# Patient Record
Sex: Female | Born: 1945
Health system: Southern US, Community
[De-identification: ages and names within clinical notes are randomized; demographics above are authoritative.]

## PROBLEM LIST (undated history)

## (undated) DIAGNOSIS — M1990 Unspecified osteoarthritis, unspecified site: Secondary | ICD-10-CM

## (undated) DIAGNOSIS — E785 Hyperlipidemia, unspecified: Secondary | ICD-10-CM

## (undated) DIAGNOSIS — T7840XA Allergy, unspecified, initial encounter: Secondary | ICD-10-CM

## (undated) DIAGNOSIS — E079 Disorder of thyroid, unspecified: Secondary | ICD-10-CM

## (undated) DIAGNOSIS — F79 Unspecified intellectual disabilities: Secondary | ICD-10-CM

## (undated) HISTORY — DX: Hyperlipidemia, unspecified: E78.5

## (undated) HISTORY — DX: Allergy, unspecified, initial encounter: T78.40XA

## (undated) HISTORY — DX: Unspecified osteoarthritis, unspecified site: M19.90

## (undated) HISTORY — PX: KNEE SURGERY: SHX244

## (undated) HISTORY — DX: Disorder of thyroid, unspecified: E07.9

---

## 2004-11-27 ENCOUNTER — Encounter: Admission: RE | Admit: 2004-11-27 | Discharge: 2004-11-27 | Payer: Self-pay | Admitting: Family Medicine

## 2004-12-16 ENCOUNTER — Other Ambulatory Visit: Admission: RE | Admit: 2004-12-16 | Discharge: 2004-12-16 | Payer: Self-pay | Admitting: Family Medicine

## 2006-01-22 ENCOUNTER — Encounter: Admission: RE | Admit: 2006-01-22 | Discharge: 2006-01-22 | Payer: Self-pay | Admitting: Family Medicine

## 2007-01-25 ENCOUNTER — Encounter: Admission: RE | Admit: 2007-01-25 | Discharge: 2007-01-25 | Payer: Self-pay | Admitting: Family Medicine

## 2008-01-30 ENCOUNTER — Encounter: Admission: RE | Admit: 2008-01-30 | Discharge: 2008-01-30 | Payer: Self-pay | Admitting: Family Medicine

## 2009-09-11 ENCOUNTER — Encounter: Admission: RE | Admit: 2009-09-11 | Discharge: 2009-09-11 | Payer: Self-pay | Admitting: Family Medicine

## 2011-01-11 ENCOUNTER — Emergency Department (HOSPITAL_COMMUNITY)
Admission: EM | Admit: 2011-01-11 | Discharge: 2011-01-11 | Disposition: A | Discharge status: Home / Self Care | Payer: Medicare Other | Attending: Emergency Medicine | Admitting: Emergency Medicine

## 2011-01-11 ENCOUNTER — Emergency Department (HOSPITAL_COMMUNITY): Payer: Medicare Other

## 2011-01-11 ENCOUNTER — Other Ambulatory Visit: Payer: Self-pay

## 2011-01-11 DIAGNOSIS — R0602 Shortness of breath: Secondary | ICD-10-CM | POA: Insufficient documentation

## 2011-01-11 DIAGNOSIS — R109 Unspecified abdominal pain: Secondary | ICD-10-CM | POA: Insufficient documentation

## 2011-01-11 DIAGNOSIS — R1110 Vomiting, unspecified: Secondary | ICD-10-CM | POA: Insufficient documentation

## 2011-01-11 DIAGNOSIS — R51 Headache: Secondary | ICD-10-CM | POA: Insufficient documentation

## 2011-01-11 DIAGNOSIS — R509 Fever, unspecified: Secondary | ICD-10-CM | POA: Insufficient documentation

## 2011-01-11 DIAGNOSIS — R4182 Altered mental status, unspecified: Secondary | ICD-10-CM | POA: Insufficient documentation

## 2011-01-11 DIAGNOSIS — N390 Urinary tract infection, site not specified: Secondary | ICD-10-CM

## 2011-01-11 HISTORY — DX: Unspecified intellectual disabilities: F79

## 2011-01-11 LAB — BASIC METABOLIC PANEL
CO2: 26 mEq/L (ref 19–32)
Calcium: 8.3 mg/dL — ABNORMAL LOW (ref 8.4–10.5)
Creatinine, Ser: 0.75 mg/dL (ref 0.50–1.10)
GFR calc non Af Amer: >60 mL/min (ref >60)
Glucose, Bld: 118 mg/dL — ABNORMAL HIGH (ref 70–99)
Sodium: 134 mEq/L — ABNORMAL LOW (ref 135–145)

## 2011-01-11 LAB — URINE MICROSCOPIC-ADD ON

## 2011-01-11 LAB — DIFFERENTIAL
Basophils Absolute: 0.1 10*3/uL (ref 0.0–0.1)
Basophils Relative: 1 % (ref 0–1)
Eosinophils Absolute: 0 10*3/uL (ref 0.0–0.7)
Eosinophils Relative: 0 % (ref 0–5)
Lymphocytes Relative: 11 % — ABNORMAL LOW (ref 12–46)
Neutro Abs: 7.5 10*3/uL (ref 1.7–7.7)

## 2011-01-11 LAB — CBC
MCV: 89.4 fL (ref 78.0–100.0)
RBC: 3.97 MIL/uL (ref 3.87–5.11)
RDW: 12.4 % (ref 11.5–15.5)
WBC: 9.2 10*3/uL (ref 4.0–10.5)

## 2011-01-11 LAB — URINALYSIS, ROUTINE W REFLEX MICROSCOPIC
Bilirubin Urine: NEGATIVE
Ketones, ur: NEGATIVE mg/dL
pH: 6 (ref 5.0–8.0)

## 2011-01-11 MED ORDER — ONDANSETRON HCL 4 MG PO TABS: 4.0000 mg | ORAL_TABLET | Freq: Four times a day (QID) | ORAL | Status: AC

## 2011-01-11 MED ORDER — ONDANSETRON HCL 4 MG/2ML IJ SOLN
4.0000 mg | Freq: Once | INTRAMUSCULAR | Status: AC
  Administered 2011-01-11: 4 mg via INTRAVENOUS
  Filled 2011-01-11: qty 2

## 2011-01-11 MED ORDER — CIPROFLOXACIN HCL 250 MG PO TABS: 250.0000 mg | ORAL_TABLET | Freq: Two times a day (BID) | ORAL | Status: AC

## 2011-01-11 MED ORDER — SODIUM CHLORIDE 0.9 % IV BOLUS (SEPSIS)
500.0000 mL | Freq: Once | INTRAVENOUS | Status: AC
  Administered 2011-01-11: 500 mL via INTRAVENOUS

## 2011-01-11 MED ORDER — DEXTROSE 5 % IV SOLN
1.0000 g | Freq: Once | INTRAVENOUS | Status: AC
  Administered 2011-01-11: 1 g via INTRAVENOUS
  Filled 2011-01-11: qty 1

## 2011-01-11 NOTE — ED Provider Notes (Signed)
History     CSN: [id number] Arrival date & time: 01/11/2011  7:39 PM  Chief Complaint  Patient presents with  . Emesis  . Altered Mental Status  . Gait Problem   HPI Comments: The patient lives with her family members. Last couple of days she's been using the bathroom more frequently has not been acting quite like herself. She's been sleeping a lot more and has felt flushed at times. She had a mild headache. She has not been overtly confused and has not had any focal weakness or numbness.  Patient is a 65 y.o. female presenting with vomiting and altered mental status. The history is provided by the patient and a relative. The history is limited by a language barrier (Patient is very hard of hearing and does not have her hearing aids).  Emesis  This is a new problem. Episode onset: Today. Episode frequency: Waxing and waning. Maximum temperature: Family members think she might have had a fever although they did not measure 1. Associated symptoms include chills, a fever and headaches. Pertinent negatives include no abdominal pain, no cough, no diarrhea, no sweats and no URI.  Altered Mental Status This is a recurrent (Patient has had this problems before when she's had urinary tract infections) problem. Associated symptoms include headaches and shortness of breath. Pertinent negatives include no abdominal pain. The symptoms are aggravated by nothing. The symptoms are relieved by nothing.    Past Medical History  Diagnosis Date  . Mentally disabled     Past Surgical History  Procedure Date  . Knee surgery     No family history on file.  History  Substance Use Topics  . Smoking status: Never Smoker   . Smokeless tobacco: Not on file  . Alcohol Use: No    OB History    Grav Para Term Preterm Abortions TAB SAB Ect Mult Living                  Review of Systems  Constitutional: Positive for fever and chills.  Respiratory: Positive for shortness of breath. Negative for cough.     Gastrointestinal: Positive for vomiting. Negative for abdominal pain and diarrhea.  Neurological: Positive for headaches.  Psychiatric/Behavioral: Positive for altered mental status.  All other systems reviewed and are negative.    Physical Exam  BP 130/58  Pulse 68  Temp(Src) 98 F (36.7 C) (Oral)  Resp 18  Ht 5\' 2"  (1.575 m)  Wt 163 lb 7 oz (74.135 kg)  BMI 29.89 kg/m2  SpO2 97%  Physical Exam  Nursing note and vitals reviewed. Constitutional: She appears well-developed and well-nourished. No distress.  HENT:  Head: Normocephalic and atraumatic.  Right Ear: External ear normal.  Left Ear: External ear normal.  Eyes: Conjunctivae are normal. Right eye exhibits no discharge. Left eye exhibits no discharge. No scleral icterus.  Neck: Neck supple. No tracheal deviation present.  Cardiovascular: Normal rate, regular rhythm and intact distal pulses.   Pulmonary/Chest: Effort normal and breath sounds normal. No stridor. No respiratory distress. She has no wheezes. She has no rales.  Abdominal: Soft. Bowel sounds are normal. She exhibits no distension. There is no tenderness. There is no rebound and no guarding.  Musculoskeletal: She exhibits no edema and no tenderness.  Neurological: She is alert. She has normal strength. She displays no atrophy and no tremor. No cranial nerve deficit ( no gross defecits noted) or sensory deficit. She exhibits normal muscle tone. She displays no seizure activity. Coordination normal.  Skin: Skin is warm and dry. No rash noted. She is not diaphoretic. No pallor.  Psychiatric: She has a normal mood and affect.    ED Course  Procedures Labs Reviewed  URINALYSIS, ROUTINE W REFLEX MICROSCOPIC - Abnormal; Notable for the following:    Appearance HAZY (*)    Hgb urine dipstick SMALL (*)    Leukocytes, UA SMALL (*)    All other components within normal limits  CBC - Abnormal; Notable for the following:    HCT 35.5 (*)    All other components within  normal limits  DIFFERENTIAL - Abnormal; Notable for the following:    Neutrophils Relative 82 (*)    Lymphocytes Relative 11 (*)    All other components within normal limits  BASIC METABOLIC PANEL - Abnormal; Notable for the following:    Sodium 134 (*)    Glucose, Bld 118 (*)    Calcium 8.3 (*)    All other components within normal limits  URINE MICROSCOPIC-ADD ON - Abnormal; Notable for the following:    Squamous Epithelial / LPF MANY (*)    Bacteria, UA MANY (*)    All other components within normal limits   Ct Head Wo Contrast  01/11/2011  *RADIOLOGY REPORT*  Clinical Data: Altered mental status, unsteady gait  CT HEAD WITHOUT CONTRAST  Technique:  Contiguous axial images were obtained from the base of the skull through the vertex without contrast.  Comparison: None.  Findings: No acute hemorrhage, acute infarction, or mass lesion is identified.  No midline shift.  No ventriculomegaly.  No skull fracture.  Partial opacification of the sphenoid sinus is noted. No osseous destruction or air-fluid level.  Mucoperiosteal thickening of the maxillary and ethmoid sinuses is noted.  IMPRESSION: No acute intracranial finding.  Sinusitis.  Original Report Authenticated By: Harrel Lemon, M.D.   Dg Abd Acute W/chest  01/11/2011  *RADIOLOGY REPORT*  Clinical Data: Abdominal pain, weakness  ACUTE ABDOMEN SERIES (ABDOMEN 2 VIEW & CHEST 1 VIEW)  Comparison: None.  Findings: Lung volumes are low with crowding of the bronchovascular markings.  Heart size is mildly enlarged, in part to technique and hypoaeration.  No focal pulmonary opacity.  No pleural effusion. No free air beneath the diaphragms.  Artifact is present on the upright view.  Radiopaque material within the nondilated colon is likely related to ingested material. Gas within presumed redundant sigmoid is noted over the left mid abdomen.  No dilated loop of small bowel or colon.  No air fluid level.  Moderate stool volume noted.  No acute  osseous finding.  IMPRESSION: No acute cardiopulmonary process.  Nonobstructive bowel gas pattern.  Original Report Authenticated By: Harrel Lemon, M.D.     Date: 01/11/2011  Rate: 71  Rhythm: normal sinus rhythm  QRS Axis: normal  Intervals: normal  ST/T Wave abnormalities: normal  Conduction Disutrbances:none  Narrative Interpretation: nl ecg  Old EKG Reviewed: none available    MDM  Patient with unremarkable workup in the emergency department other than possible urinary tract infection. I suspect this might be allergy for her sleepiness and vomiting that she's had today. There doesn't appear to be any evidence of an acute abdominal etiology such as bowel obstruction. She has no tenderness to suggest appendicitis, cholecystitis, pancreatitis, diverticulitis. EKG is normal doubt any cardiac etiology. There is no focal does to suggest any acute neurological cause at this time.  Clinical impression urinary tract infection       Celene Kras, MD  01/11/11 2213 

## 2011-01-11 NOTE — ED Notes (Signed)
Caregiver, reports that pt has been using the bathroom more freq. For several days, today has not been acting like herself. Unsteady gait, sleeping more, felt hot at times.Marland Kitchen

## 2011-01-13 LAB — URINE CULTURE

## 2011-08-18 DIAGNOSIS — R350 Frequency of micturition: Secondary | ICD-10-CM | POA: Diagnosis not present

## 2011-08-18 DIAGNOSIS — Z111 Encounter for screening for respiratory tuberculosis: Secondary | ICD-10-CM | POA: Diagnosis not present

## 2011-08-18 DIAGNOSIS — D649 Anemia, unspecified: Secondary | ICD-10-CM | POA: Diagnosis not present

## 2012-07-19 IMAGING — CT CT HEAD W/O CM
1 series · 16 of 30 positions shown, 20 images · non-contrast
Comparison: None.

CLINICAL DATA: Altered mental status, unsteady gait

CT HEAD WITHOUT CONTRAST
TECHNIQUE: Contiguous axial images were obtained from the base of
the skull through the vertex without contrast.

[Series 2: headtrauma 4.8 h37s · axial · 0.43mm/px · z∈[+1193,+1347]mm · 16 of 36 slices shown, 20 images]
[im 2/36  brain]
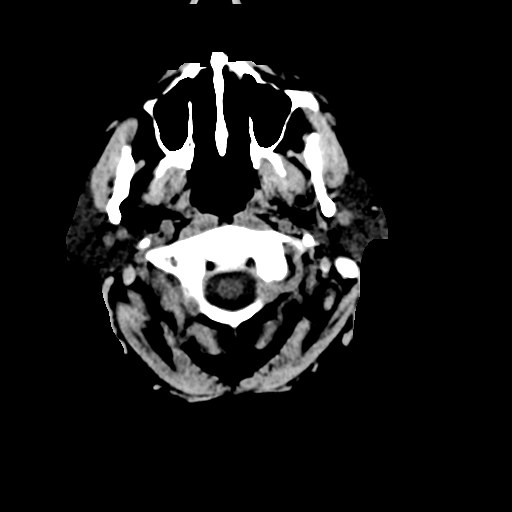
[im 2/36  bone]
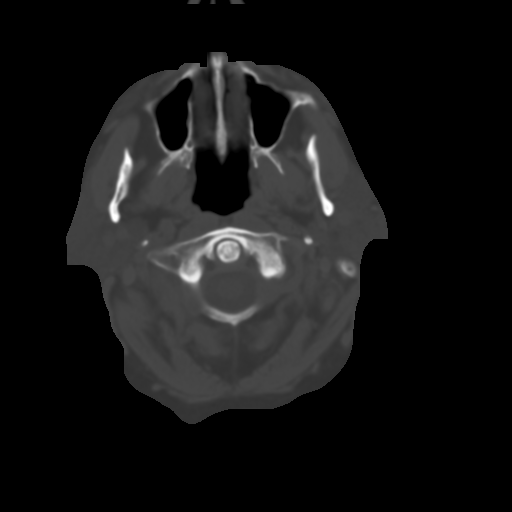
[im 4/36  brain]
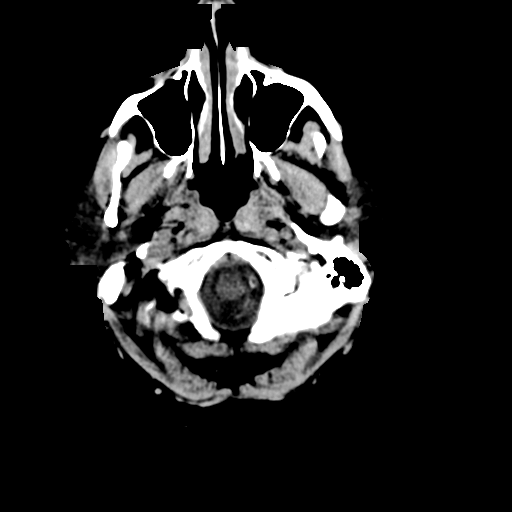
[im 7/36  brain]
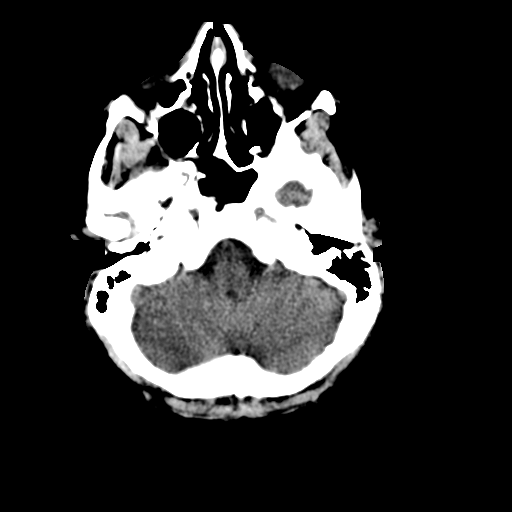
[im 9/36  brain]
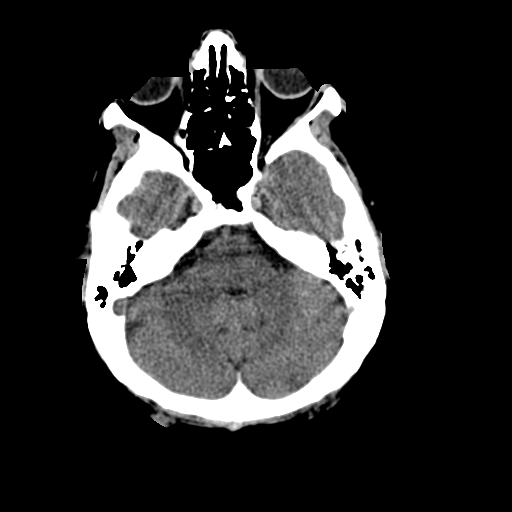
[im 10/36  brain]
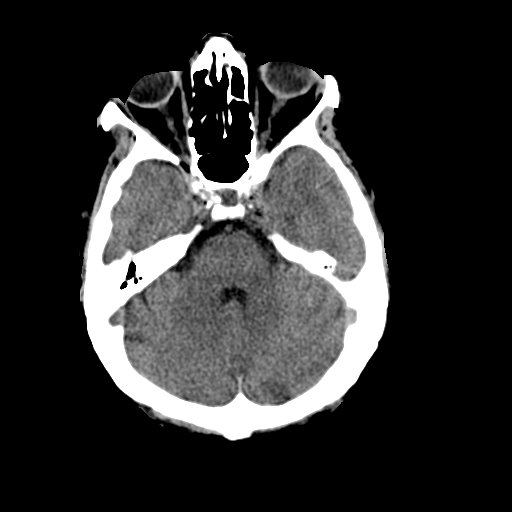
[im 10/36  bone]
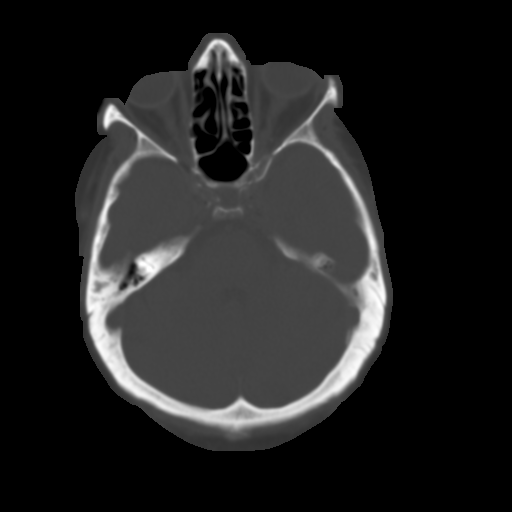
[im 13/36  brain]
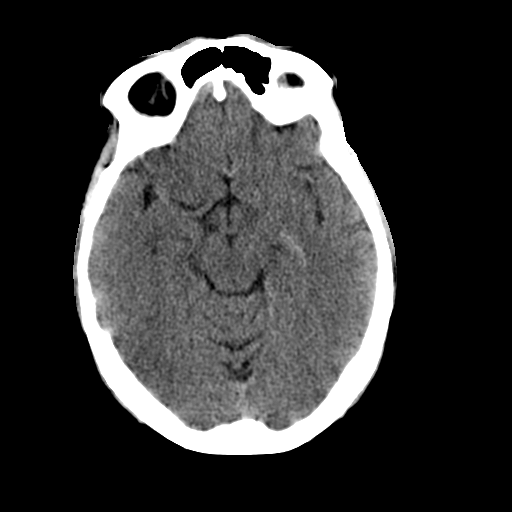
[im 15/36  brain]
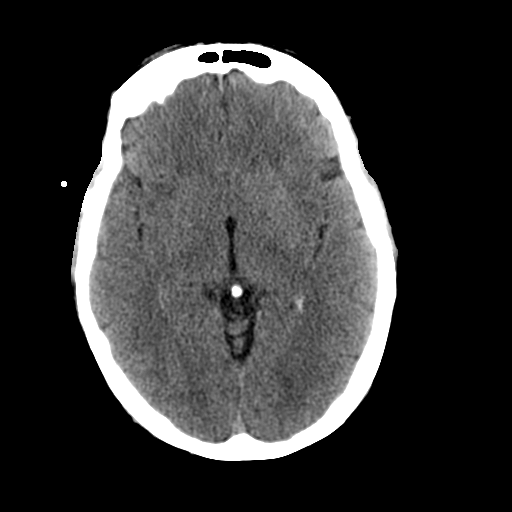
[im 17/36  brain]
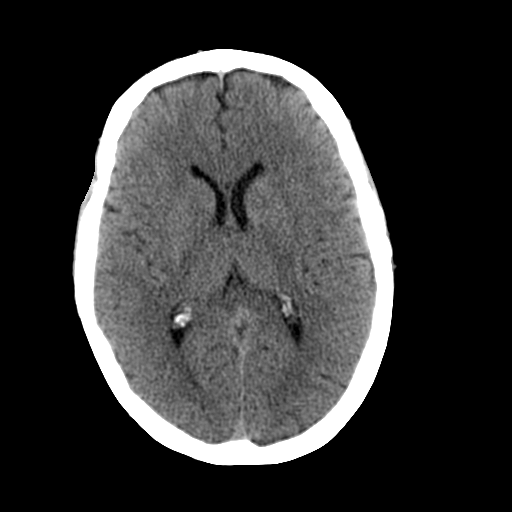
[im 19/36  brain]
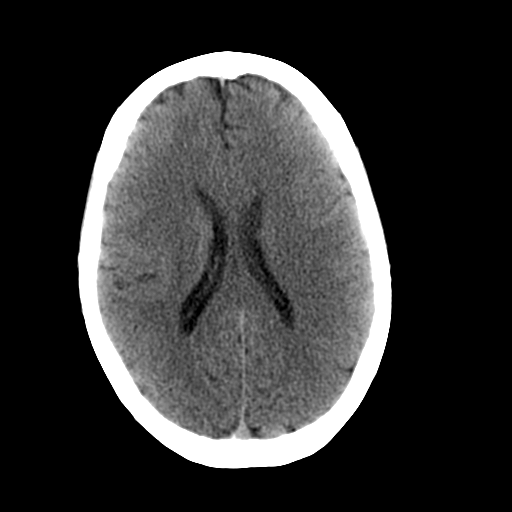
[im 19/36  bone]
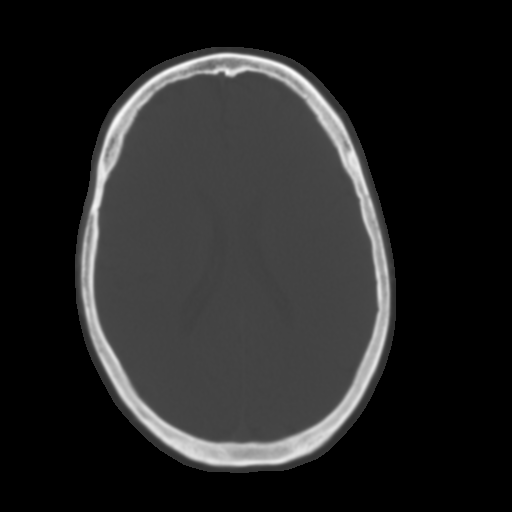
[im 21/36  brain]
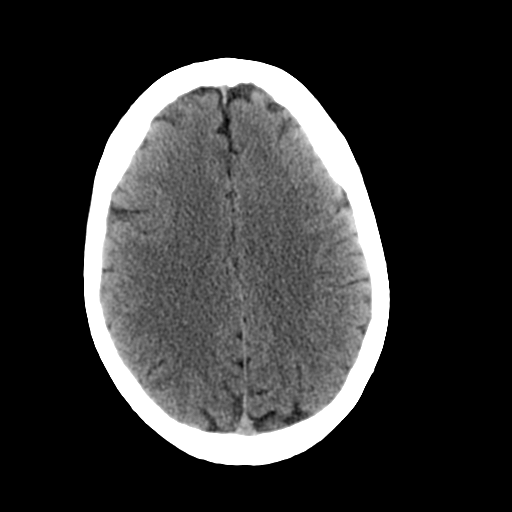
[im 23/36  brain]
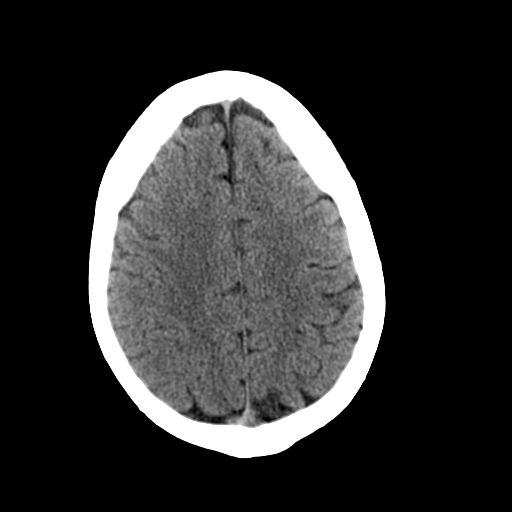
[im 26/36  brain]
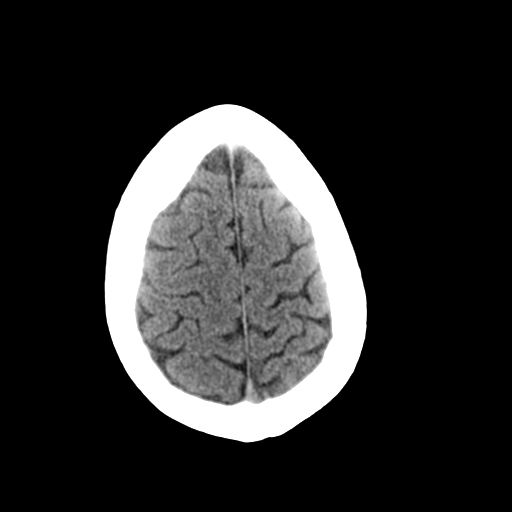
[im 27/36  brain]
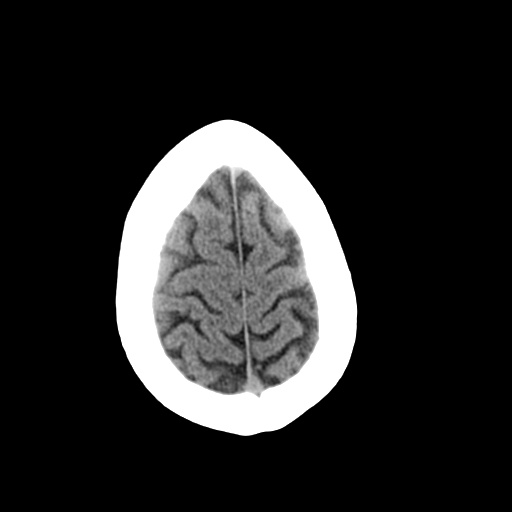
[im 27/36  bone]
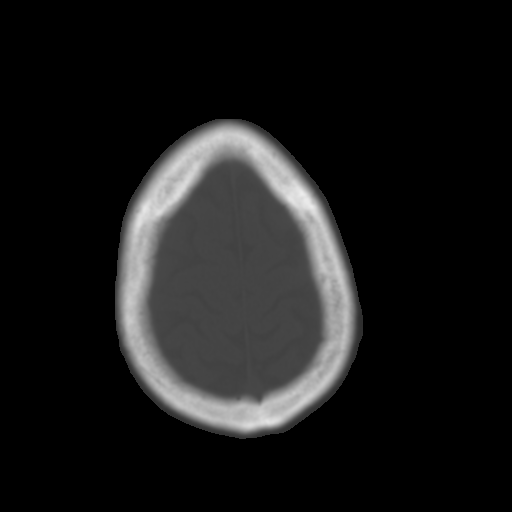
[im 29/36  brain]
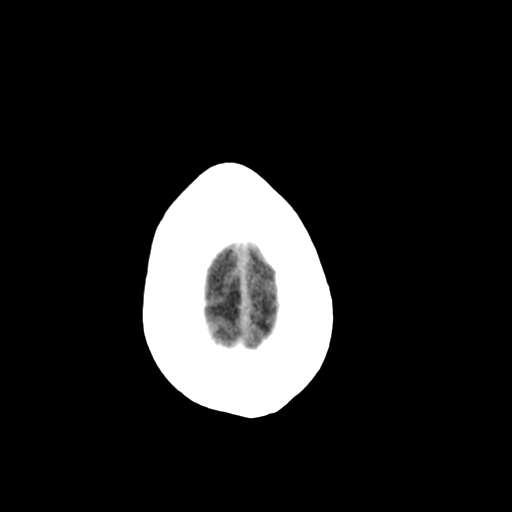
[im 32/36  brain]
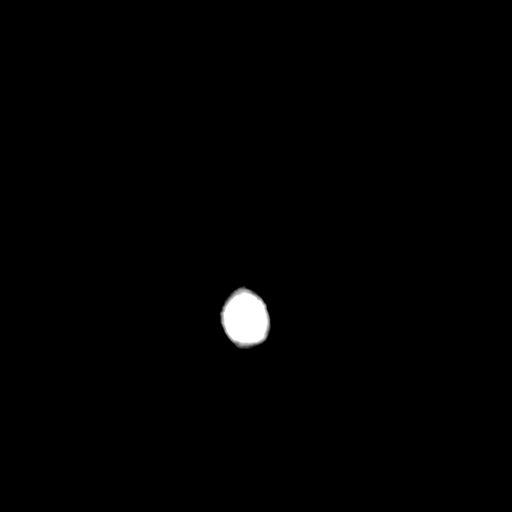
[im 34/36  brain]
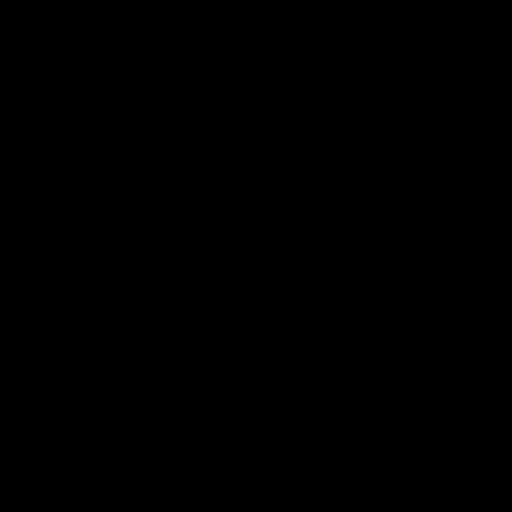

[16 of 30 positions shown; findings below may reference images not displayed]

FINDINGS: No acute hemorrhage, acute infarction, or mass lesion is
identified.  No midline shift.  No ventriculomegaly.  No skull
fracture.  Partial opacification of the sphenoid sinus is noted.
No osseous destruction or air-fluid level.  Mucoperiosteal
thickening of the maxillary and ethmoid sinuses is noted.
IMPRESSION: No acute intracranial finding.  Sinusitis.

## 2012-09-01 ENCOUNTER — Ambulatory Visit: Payer: Self-pay | Admitting: Nurse Practitioner

## 2012-09-20 ENCOUNTER — Ambulatory Visit (INDEPENDENT_AMBULATORY_CARE_PROVIDER_SITE_OTHER): Payer: Medicare Other | Admitting: Nurse Practitioner

## 2012-09-20 ENCOUNTER — Encounter: Payer: Self-pay | Admitting: Nurse Practitioner

## 2012-09-20 VITALS — BP 149/66 | HR 59 | Temp 97.1°F | Resp 97 | Ht 62.5 in | Wt 155.0 lb

## 2012-09-20 DIAGNOSIS — M899 Disorder of bone, unspecified: Secondary | ICD-10-CM

## 2012-09-20 DIAGNOSIS — Z111 Encounter for screening for respiratory tuberculosis: Secondary | ICD-10-CM | POA: Diagnosis not present

## 2012-09-20 DIAGNOSIS — F79 Unspecified intellectual disabilities: Secondary | ICD-10-CM | POA: Insufficient documentation

## 2012-09-20 DIAGNOSIS — E039 Hypothyroidism, unspecified: Secondary | ICD-10-CM

## 2012-09-20 DIAGNOSIS — M949 Disorder of cartilage, unspecified: Secondary | ICD-10-CM | POA: Diagnosis not present

## 2012-09-20 DIAGNOSIS — M8580 Other specified disorders of bone density and structure, unspecified site: Secondary | ICD-10-CM

## 2012-09-20 LAB — COMPLETE METABOLIC PANEL WITH GFR
AST: 25 U/L (ref 0–37)
Albumin: 4.3 g/dL (ref 3.5–5.2)
Alkaline Phosphatase: 106 U/L (ref 39–117)
BUN: 14 mg/dL (ref 6–23)
CO2: 30 mEq/L (ref 19–32)
Calcium: 9.4 mg/dL (ref 8.4–10.5)
Creat: 0.71 mg/dL (ref 0.50–1.10)
GFR, Est African American: >89 mL/min
Glucose, Bld: 84 mg/dL (ref 70–99)
Total Bilirubin: 0.5 mg/dL (ref 0.3–1.2)

## 2012-09-20 LAB — THYROID PANEL WITH TSH
Free Thyroxine Index: 3.2 (ref 1.0–3.9)
T3 Uptake: 28.7 % (ref 22.5–37.0)
T4, Total: 11.3 ug/dL (ref 5.0–12.5)

## 2012-09-20 MED ORDER — TUBERCULIN PPD 5 UNIT/0.1ML ID SOLN: 5.0000 [IU] | Freq: Once | INTRADERMAL | Status: DC

## 2012-09-20 NOTE — Patient Instructions (Signed)
TB (Tuberculosis) Tine Test  The tuberculin tine test is used to determine whether someone has come in contact with the bacteria that cause a disease called tuberculosis.  HOW THE TEST IS DONE       This test uses a tiny spiked instrument to inject a small amount of the tuberculosis dead protein material (antigen) just under your skin. This is most commonly done on the arm. Usually, the area is marked with an ink pen. That way it can be checked for any redness and swelling. It is usually checked in 2 to 3 days.  Note: Another test, called the "tuberculin skin test" (also called a PPD), is more accurate than the TB tine test. It is the preferred method of determining exposure to tuberculosis.  PREPARATION FOR TEST       There is no special preparation. People with a skin rash or other skin irritations on their arms may need to have the test performed at a different spot on the body.  HOW THE TEST WILL FEEL       Some people feel a slight stinging sensation when the instrument is inserted under the skin. After the test, the area may itch or burn.  MEANING OF THE TEST       This test helps determine if you have ever been exposed to a disease called tuberculosis. If so, your immune system produced antibodies to help fight the disease. These remain in your body. When this test is performed, those with antibodies to tuberculosis will have a positive test result.  OBTAINING THE TEST RESULTS  The area needs to be checked for any redness and swelling at a later time, usually in 2 to 3 days. Follow your caregiver's instructions as to where and when to report for this to be done. It is your responsibility to obtain your test results. Ask the lab or department performing the test when and how you will get your results.  NORMAL FINDINGS       If you have a negative test result, the area may be a little red, but will not be swollen and firm like a mosquito bite. This means you have not been exposed to the bacteria that cause tuberculosis.  WHAT ABNORMAL RESULTS MEAN       If you have been exposed to tuberculosis, the area will become red and swell like a mosquito bit in 48 to 72 hours. This is considered a positive test result. It means your body's immune system detected the substance injected under your skin. A positive TB tine test does not mean that you have active tuberculosis. It only means that you have been exposed at some point in the past.    A chest x-ray may be taken to evaluate whether you have active tuberculosis.  Once you have been exposed, all future TB tine tests will be positive. If you have a positive TB tine test, the Centers for Disease Control and Prevention (CDC) recommends that you have a TB skin test (PPD, see above), unless the first tine test had a blistering or very large reaction.  RISKS AND COMPLICATIONS      The risk of severe side effects is very low. In the unlikely event that a side effect occurs, typical ones include itching and hives. Rarely, the area may blister, or the area of swelling may become very large.  Tell your health care provider if you have any severe reactions.  CONSIDERATIONS       The   test results may be incorrect (false negative). False negative means the test suggests you have not been exposed to tuberculosis, but you really have been.  This is more likely in the elderly and in patients with weakened immune systems, such as:   AIDS patients   Cancer patients who receive chemotherapy   Those who receive organ transplants   Anyone taking high doses of prescription steroid medication  Document Released: 10/19/2006 Document Revised: 07/27/2011 Document Reviewed: 11/15/2006  [employer] Patient Information 2013 [employer]

## 2012-09-20 NOTE — Progress Notes (Signed)
  Subjective:    Patient ID: Terri Shelton, female    DOB: Aug 23, 1945, 67 y.o.   MRN: [id number]  HPI- Patient in for follow-up- She has a history of hypothyroidism but has never been on any meds. She is mentally handicap and lives with her niece. Her niece brings her i today stating sh ehas had no compliants an dis doing quite well.    Review of Systems  Constitutional: Negative.   HENT: Negative.   Eyes: Negative.   Respiratory: Negative.   Cardiovascular: Negative.   Gastrointestinal: Negative.   Genitourinary: Negative.   Musculoskeletal: Negative.   Neurological: Negative.   Hematological: Negative.   Psychiatric/Behavioral: Negative.        Objective:   Physical Exam  Constitutional: She is oriented to person, place, and time. She appears well-developed and well-nourished.  HENT:  Nose: Nose normal.  Mouth/Throat: Oropharynx is clear and moist.  Right cerumen impaction  S/P irrigation TM WNL  Eyes: EOM are normal.  Neck: Trachea normal, normal range of motion and full passive range of motion without pain. Neck supple. No JVD present. Carotid bruit is not present. No thyromegaly present.  Cardiovascular: Normal rate, regular rhythm, normal heart sounds and intact distal pulses.  Exam reveals no gallop and no friction rub.   No murmur heard. Pulmonary/Chest: Effort normal and breath sounds normal.  Abdominal: Soft. Bowel sounds are normal. She exhibits no distension and no mass. There is no tenderness.  Musculoskeletal: Normal range of motion.  Lymphadenopathy:    She has no cervical adenopathy.  Neurological: She is alert and oriented to person, place, and time. She has normal reflexes.  Skin: Skin is warm and dry.  Psychiatric: She has a normal mood and affect. Her behavior is normal. Judgment and thought content normal.    BP 149/66  Pulse 59  Temp(Src) 97.1 F (36.2 C) (Oral)  Resp 97  Ht 5' 2.5" (1.588 m)  Wt 155 lb (70.308 kg)  BMI 27.88 kg/m2       Assessment & Plan:  1. Encounter for TB tine test Return to office Thursday to have read - TB Skin Test - tuberculin injection 5 Units; Inject 0.1 mLs (5 Units total) into the skin once.  2. Hypothyroidism Labs ending - COMPLETE METABOLIC PANEL WITH GFR - NMR Lipoprofile with Lipids - Thyroid Panel With TSH  3. Osteopenia Dexa scan not due till August  4. Mental handicap  Mary-Margaret Daphine Deutscher, FNP

## 2012-09-22 LAB — NMR LIPOPROFILE WITH LIPIDS
HDL-C: 39 mg/dL — ABNORMAL LOW (ref >=40)
LDL Particle Number: 1280 nmol/L — ABNORMAL HIGH (ref <1000)
LP-IR Score: 27 (ref <=45)
Large HDL-P: 5.5 umol/L (ref >=4.8)
Small LDL Particle Number: 407 nmol/L (ref <=527)
Triglycerides: 141 mg/dL (ref <150)
VLDL Size: 42.4 nm (ref <=46.6)

## 2014-01-15 ENCOUNTER — Ambulatory Visit (INDEPENDENT_AMBULATORY_CARE_PROVIDER_SITE_OTHER): Payer: Medicare Other | Admitting: Family

## 2014-01-15 ENCOUNTER — Encounter: Payer: Self-pay | Admitting: Family

## 2014-01-15 ENCOUNTER — Encounter (INDEPENDENT_AMBULATORY_CARE_PROVIDER_SITE_OTHER): Payer: Self-pay

## 2014-01-15 VITALS — BP 124/73 | HR 81 | Temp 96.7°F | Ht 62.5 in | Wt 162.8 lb

## 2014-01-15 DIAGNOSIS — M949 Disorder of cartilage, unspecified: Secondary | ICD-10-CM | POA: Diagnosis not present

## 2014-01-15 DIAGNOSIS — Z0000 Encounter for general adult medical examination without abnormal findings: Secondary | ICD-10-CM | POA: Diagnosis not present

## 2014-01-15 DIAGNOSIS — R7309 Other abnormal glucose: Secondary | ICD-10-CM | POA: Diagnosis not present

## 2014-01-15 DIAGNOSIS — M899 Disorder of bone, unspecified: Secondary | ICD-10-CM

## 2014-01-15 DIAGNOSIS — Z13820 Encounter for screening for osteoporosis: Secondary | ICD-10-CM

## 2014-01-15 DIAGNOSIS — Z23 Encounter for immunization: Secondary | ICD-10-CM | POA: Diagnosis not present

## 2014-01-15 DIAGNOSIS — E039 Hypothyroidism, unspecified: Secondary | ICD-10-CM

## 2014-01-15 DIAGNOSIS — F79 Unspecified intellectual disabilities: Secondary | ICD-10-CM | POA: Diagnosis not present

## 2014-01-15 LAB — POCT URINALYSIS DIPSTICK
BILIRUBIN UA: NEGATIVE
GLUCOSE UA: NEGATIVE
Ketones, UA: NEGATIVE
Nitrite, UA: NEGATIVE
Protein, UA: NEGATIVE
Spec Grav, UA: >=1.03
UROBILINOGEN UA: NEGATIVE
pH, UA: 6

## 2014-01-15 LAB — POCT GLYCOSYLATED HEMOGLOBIN (HGB A1C): Hemoglobin A1C: 5.1

## 2014-01-15 LAB — POCT UA - MICROSCOPIC ONLY
Bacteria, U Microscopic: NEGATIVE
CASTS, UR, LPF, POC: NEGATIVE
CRYSTALS, UR, HPF, POC: NEGATIVE
Mucus, UA: NEGATIVE

## 2014-01-15 NOTE — Progress Notes (Signed)
   Subjective:    Patient ID: Terri Shelton, female    DOB: 1946/05/14, 68 y.o.   MRN: [id number]  HPI Pt presents to the office for annual physical. Pt is brought in by her Ex-sister-in-law. Pt goes to the [facility] during the day and then is taken care of the by sister-in-law's daughter in the evening. Pt denies any pain, SOB, or edema.    Review of Systems  Constitutional: Negative.   HENT: Negative.   Eyes: Negative.   Respiratory: Negative.  Negative for shortness of breath.   Cardiovascular: Negative.  Negative for palpitations.  Gastrointestinal: Negative.   Endocrine: Negative.   Genitourinary: Negative.   Musculoskeletal: Negative.   Neurological: Negative.  Negative for headaches.  Hematological: Negative.   Psychiatric/Behavioral: Negative.   All other systems reviewed and are negative.      Objective:   Physical Exam  Vitals reviewed. Constitutional: She is oriented to person, place, and time. She appears well-developed and well-nourished. No distress.  HENT:  Head: Normocephalic and atraumatic.  Right Ear: External ear normal.  Left Ear: External ear normal.  Nose: Nose normal.  Mouth/Throat: Oropharynx is clear and moist.  Eyes: Pupils are equal, round, and reactive to light.  Neck: Normal range of motion. Neck supple. No thyromegaly present.  Cardiovascular: Normal rate, regular rhythm, normal heart sounds and intact distal pulses.   No murmur heard. Pulmonary/Chest: Effort normal and breath sounds normal. No respiratory distress. She has no wheezes.  Abdominal: Soft. Bowel sounds are normal. She exhibits no distension. There is no tenderness.  Musculoskeletal: Normal range of motion. She exhibits no edema and no tenderness.  Neurological: She is alert and oriented to person, place, and time. She has normal reflexes. No cranial nerve deficit.  Skin: Skin is warm and dry.  Psychiatric: She has a normal mood and affect. Her behavior is normal.  Judgment and thought content normal.    BP 124/73  Pulse 81  Temp(Src) 96.7 F (35.9 C) (Oral)  Ht 5' 2.5" (1.588 m)  Wt 162 lb 12.8 oz (73.846 kg)  BMI 29.28 kg/m2       Assessment & Plan:  1. Mental handicap  2. Annual physical exam - CMP14+EGFR - Lipid panel - Vit D  25 hydroxy (rtn osteoporosis monitoring) - Thyroid Panel With TSH - POCT UA - Microscopic Only - POCT urinalysis dipstick - POCT glycosylated hemoglobin (Hb A1C)  3. Osteoporosis screening - DG Bone Density; Future   Continue all meds Labs pending Health Maintenance reviewed Diet and exercise encouraged RTO 1 year  Evelina Dun, FNP

## 2014-01-15 NOTE — Patient Instructions (Signed)

## 2014-01-16 LAB — LIPID PANEL
Chol/HDL Ratio: 4.6 ratio units — ABNORMAL HIGH (ref 0.0–4.4)
Cholesterol, Total: 213 mg/dL — ABNORMAL HIGH (ref 100–199)
HDL: 46 mg/dL (ref >39)
LDL CALC: 138 mg/dL — AB (ref 0–99)
TRIGLYCERIDES: 146 mg/dL (ref 0–149)
VLDL CHOLESTEROL CAL: 29 mg/dL (ref 5–40)

## 2014-01-16 LAB — CMP14+EGFR
ALBUMIN: 4.3 g/dL (ref 3.6–4.8)
ALK PHOS: 120 IU/L — AB (ref 39–117)
ALT: 18 IU/L (ref 0–32)
AST: 22 IU/L (ref 0–40)
Albumin/Globulin Ratio: 1.4 (ref 1.1–2.5)
BILIRUBIN TOTAL: 0.3 mg/dL (ref 0.0–1.2)
BUN/Creatinine Ratio: 18 (ref 11–26)
BUN: 14 mg/dL (ref 8–27)
CO2: 21 mmol/L (ref 18–29)
CREATININE: 0.76 mg/dL (ref 0.57–1.00)
Calcium: 9.1 mg/dL (ref 8.7–10.3)
Chloride: 98 mmol/L (ref 97–108)
GFR calc Af Amer: 93 mL/min/{1.73_m2} (ref >59)
GFR, EST NON AFRICAN AMERICAN: 81 mL/min/{1.73_m2} (ref >59)
GLOBULIN, TOTAL: 3 g/dL (ref 1.5–4.5)
Glucose: 81 mg/dL (ref 65–99)
POTASSIUM: 4.5 mmol/L (ref 3.5–5.2)
SODIUM: 138 mmol/L (ref 134–144)
Total Protein: 7.3 g/dL (ref 6.0–8.5)

## 2014-01-16 LAB — THYROID PANEL WITH TSH
FREE THYROXINE INDEX: 2 (ref 1.2–4.9)
T3 Uptake Ratio: 23 % — ABNORMAL LOW (ref 24–39)
T4 TOTAL: 8.5 ug/dL (ref 4.5–12.0)
TSH: 5.24 u[IU]/mL — AB (ref 0.450–4.500)

## 2014-01-16 LAB — VITAMIN D 25 HYDROXY (VIT D DEFICIENCY, FRACTURES): Vit D, 25-Hydroxy: 20.5 ng/mL — ABNORMAL LOW (ref 30.0–100.0)

## 2014-01-18 ENCOUNTER — Other Ambulatory Visit: Payer: Self-pay | Admitting: Family

## 2014-01-18 MED ORDER — LEVOTHYROXINE SODIUM 50 MCG PO TABS: 50.0000 ug | ORAL_TABLET | Freq: Every day | ORAL | Status: DC

## 2014-01-18 MED ORDER — NITROFURANTOIN MONOHYD MACRO 100 MG PO CAPS: 100.0000 mg | ORAL_CAPSULE | Freq: Two times a day (BID) | ORAL | Status: DC

## 2014-01-18 MED ORDER — SIMVASTATIN 20 MG PO TABS: 20.0000 mg | ORAL_TABLET | Freq: Every day | ORAL | Status: DC

## 2014-01-18 MED ORDER — VITAMIN D (ERGOCALCIFEROL) 1.25 MG (50000 UNIT) PO CAPS: 50000.0000 [IU] | ORAL_CAPSULE | ORAL | Status: DC

## 2014-01-26 ENCOUNTER — Telehealth: Payer: Self-pay | Admitting: Family

## 2014-01-26 MED ORDER — CIPROFLOXACIN HCL 500 MG PO TABS: 500.0000 mg | ORAL_TABLET | Freq: Two times a day (BID) | ORAL | Status: DC

## 2014-01-26 NOTE — Telephone Encounter (Signed)
New antibiotic sent to pharmacy per pt request.

## 2014-03-21 ENCOUNTER — Other Ambulatory Visit: Payer: Medicare Other

## 2014-03-21 ENCOUNTER — Ambulatory Visit: Payer: Medicare Other

## 2015-01-04 ENCOUNTER — Ambulatory Visit (INDEPENDENT_AMBULATORY_CARE_PROVIDER_SITE_OTHER): Payer: Medicare Other | Admitting: *Deleted

## 2015-01-04 DIAGNOSIS — Z111 Encounter for screening for respiratory tuberculosis: Secondary | ICD-10-CM | POA: Diagnosis not present

## 2015-01-04 NOTE — Progress Notes (Signed)
Patient tolerated well.  She will return on Monday, 8/22 before 3:45

## 2015-01-07 LAB — TB SKIN TEST
Induration: 0 mm
TB SKIN TEST: NEGATIVE

## 2015-01-08 ENCOUNTER — Encounter: Payer: Self-pay | Admitting: Family Medicine

## 2015-01-08 ENCOUNTER — Ambulatory Visit (INDEPENDENT_AMBULATORY_CARE_PROVIDER_SITE_OTHER): Payer: Medicare Other | Admitting: Family Medicine

## 2015-01-08 VITALS — BP 117/59 | HR 76 | Temp 97.0°F | Ht 62.5 in | Wt 163.0 lb

## 2015-01-08 DIAGNOSIS — E034 Atrophy of thyroid (acquired): Secondary | ICD-10-CM | POA: Diagnosis not present

## 2015-01-08 DIAGNOSIS — E038 Other specified hypothyroidism: Secondary | ICD-10-CM | POA: Diagnosis not present

## 2015-01-08 DIAGNOSIS — E785 Hyperlipidemia, unspecified: Secondary | ICD-10-CM

## 2015-01-08 DIAGNOSIS — Z0000 Encounter for general adult medical examination without abnormal findings: Secondary | ICD-10-CM

## 2015-01-08 NOTE — Progress Notes (Signed)
BP 117/59 mmHg  Pulse 76  Temp(Src) 97 F (36.1 C) (Oral)  Ht 5' 2.5" (1.588 m)  Wt 163 lb (73.936 kg)  BMI 29.32 kg/m2   Subjective:    Patient ID: Terri Shelton, female    DOB: [birth year], 69 y.o.   MRN: [id number]  HPI: Terri Shelton is a 69 y.o. female presenting on 01/08/2015 for Establish Care   HPI Wellness exam Patient presents today for a physical exam and update on medical history prior to entering an assisted living facility. She does have a mental handicap which she has had her whole life. She is brought in by a family member today who denies any major issues medically.  Hypothyroidism Patient has had hypothyroidism for a couple years and has been on 50 g of Synthroid. Denies any issues or side effects from it, has been having weight gain but that no symptoms from low thyroid or hyperthyroid.  Hyperlipidemia Patient has been on Zocor 20 mg for a couple years now. Patient denies headaches, blurred vision, chest pains, shortness of breath, or weakness. Denies any side effects from medication and is content with current medication. Has not had a recent check.  Relevant past medical, surgical, family and social history reviewed and updated as indicated. Interim medical history since our last visit reviewed. Allergies and medications reviewed and updated.  Review of Systems  Constitutional: Negative for fever and chills.  HENT: Negative for congestion, ear discharge and ear pain.   Eyes: Negative for redness and visual disturbance.  Respiratory: Negative for chest tightness and shortness of breath.   Cardiovascular: Negative for chest pain and leg swelling.  Endocrine: Negative for cold intolerance and heat intolerance.  Genitourinary: Negative for dysuria and difficulty urinating.  Musculoskeletal: Negative for back pain and gait problem.  Skin: Negative for rash.  Neurological: Negative for dizziness, light-headedness and headaches.    Psychiatric/Behavioral: Negative for behavioral problems and agitation.  All other systems reviewed and are negative.   Per HPI unless specifically indicated above     Medication List       This list is accurate as of: 01/08/15  4:55 PM.  Always use your most recent med list.               levothyroxine 50 MCG tablet  Commonly known as:  SYNTHROID, LEVOTHROID  Take 1 tablet (50 mcg total) by mouth daily.     multivitamin tablet  Take 1 tablet by mouth daily.     simvastatin 20 MG tablet  Commonly known as:  ZOCOR  Take 1 tablet (20 mg total) by mouth at bedtime.           Objective:    BP 117/59 mmHg  Pulse 76  Temp(Src) 97 F (36.1 C) (Oral)  Ht 5' 2.5" (1.588 m)  Wt 163 lb (73.936 kg)  BMI 29.32 kg/m2  Wt Readings from Last 3 Encounters:  01/08/15 163 lb (73.936 kg)  01/15/14 162 lb 12.8 oz (73.846 kg)  09/20/12 155 lb (70.308 kg)    Physical Exam  Constitutional: She is oriented to person, place, and time. She appears well-developed and well-nourished. No distress.  Eyes: Conjunctivae and EOM are normal. Pupils are equal, round, and reactive to light.  Neck: No thyromegaly present.  Cardiovascular: Normal rate and regular rhythm.   No murmur heard. Pulmonary/Chest: Effort normal and breath sounds normal. No respiratory distress. She has no wheezes.  Musculoskeletal: Normal range of motion. She exhibits no edema or  tenderness.  Lymphadenopathy:    She has no cervical adenopathy.  Neurological: She is alert and oriented to person, place, and time. Coordination normal.  Skin: Skin is warm and dry. No rash noted. She is not diaphoretic.  Psychiatric: She has a normal mood and affect. Her behavior is normal.  Vitals reviewed.   Results for orders placed or performed in visit on 01/04/15  PPD  Result Value Ref Range   TB Skin Test Negative    Induration 0 mm      Assessment & Plan:   Problem List Items Addressed This Visit      Endocrine    Hypothyroidism - Primary    Patient has been on 50 mg of Synthroid but has not been checked for sometime. We'll recheck today      Relevant Orders   Thyroid Panel With TSH     Other   Hyperlipidemia LDL goal <130    Patient has been on simvastatin 20 mg for 2 years. We'll recheck cholesterol today      Relevant Orders   BMP8+EGFR   Lipid panel   Well adult exam    Needs exam prior to entering an assisted living facility. Because of mental handicap family has been declining to do colonoscopies or pelvic exams or breast exams. Otherwise no major complaints          Follow up plan: Return in about 6 months (around 07/11/2015), or if symptoms worsen or fail to improve.  Caryl Pina, MD [facility] 01/08/2015, 4:55 PM

## 2015-01-08 NOTE — Assessment & Plan Note (Signed)
Patient has been on 50 mg of Synthroid but has not been checked for sometime. We'll recheck today

## 2015-01-08 NOTE — Patient Instructions (Signed)
Hypothyroidism The thyroid is a large gland located in the lower front of your neck. The thyroid gland helps control metabolism. Metabolism is how your body handles food. It controls metabolism with the hormone thyroxine. When this gland is underactive (hypothyroid), it produces too little hormone.  CAUSES These include:   Absence or destruction of thyroid tissue.  Goiter due to iodine deficiency.  Goiter due to medications.  Congenital defects (since birth).  Problems with the pituitary. This causes a lack of TSH (thyroid stimulating hormone). This hormone tells the thyroid to turn out more hormone. SYMPTOMS  Lethargy (feeling as though you have no energy)  Cold intolerance  Weight gain (in spite of normal food intake)  Dry skin  Coarse hair  Menstrual irregularity (if severe, may lead to infertility)  Slowing of thought processes Cardiac problems are also caused by insufficient amounts of thyroid hormone. Hypothyroidism in the newborn is cretinism, and is an extreme form. It is important that this form be treated adequately and immediately or it will lead rapidly to retarded physical and mental development. DIAGNOSIS  To prove hypothyroidism, your caregiver may do blood tests and ultrasound tests. Sometimes the signs are hidden. It may be necessary for your caregiver to watch this illness with blood tests either before or after diagnosis and treatment. TREATMENT  Low levels of thyroid hormone are increased by using synthetic thyroid hormone. This is a safe, effective treatment. It usually takes about four weeks to gain the full effects of the medication. After you have the full effect of the medication, it will generally take another four weeks for problems to leave. Your caregiver may start you on low doses. If you have had heart problems the dose may be gradually increased. It is generally not an emergency to get rapidly to normal. HOME CARE INSTRUCTIONS   Take your  medications as your caregiver suggests. Let your caregiver know of any medications you are taking or start taking. Your caregiver will help you with dosage schedules.  As your condition improves, your dosage needs may increase. It will be necessary to have continuing blood tests as suggested by your caregiver.  Report all suspected medication side effects to your caregiver. SEEK MEDICAL CARE IF: Seek medical care if you develop:  Sweating.  Tremulousness (tremors).  Anxiety.  Rapid weight loss.  Heat intolerance.  Emotional swings.  Diarrhea.  Weakness. SEEK IMMEDIATE MEDICAL CARE IF:  You develop chest pain, an irregular heart beat (palpitations), or a rapid heart beat. MAKE SURE YOU:   Understand these instructions.  Will watch your condition.  Will get help right away if you are not doing well or get worse. Document Released: 05/04/2005 Document Revised: 07/27/2011 Document Reviewed: 12/23/2007 ExitCare Patient Information 2015 [employer]. This information is not intended to replace advice given to you by your health care provider. Make sure you discuss any questions you have with your health care provider.  

## 2015-01-08 NOTE — Assessment & Plan Note (Signed)
Patient has been on simvastatin 20 mg for 2 years. We'll recheck cholesterol today

## 2015-01-09 DIAGNOSIS — Z0000 Encounter for general adult medical examination without abnormal findings: Secondary | ICD-10-CM | POA: Insufficient documentation

## 2015-01-09 LAB — THYROID PANEL WITH TSH
FREE THYROXINE INDEX: 2.2 (ref 1.2–4.9)
T3 Uptake Ratio: 24 % (ref 24–39)
T4, Total: 9.2 ug/dL (ref 4.5–12.0)
TSH: 5.53 u[IU]/mL — AB (ref 0.450–4.500)

## 2015-01-09 LAB — LIPID PANEL
CHOL/HDL RATIO: 4.9 ratio — AB (ref 0.0–4.4)
Cholesterol, Total: 175 mg/dL (ref 100–199)
HDL: 36 mg/dL — AB (ref >39)
LDL CALC: 83 mg/dL (ref 0–99)
Triglycerides: 280 mg/dL — ABNORMAL HIGH (ref 0–149)
VLDL CHOLESTEROL CAL: 56 mg/dL — AB (ref 5–40)

## 2015-01-09 LAB — BMP8+EGFR
BUN/Creatinine Ratio: 15 (ref 11–26)
BUN: 12 mg/dL (ref 8–27)
CALCIUM: 9.3 mg/dL (ref 8.7–10.3)
CO2: 25 mmol/L (ref 18–29)
CREATININE: 0.78 mg/dL (ref 0.57–1.00)
Chloride: 99 mmol/L (ref 97–108)
GFR, EST AFRICAN AMERICAN: 90 mL/min/{1.73_m2} (ref >59)
GFR, EST NON AFRICAN AMERICAN: 78 mL/min/{1.73_m2} (ref >59)
Glucose: 84 mg/dL (ref 65–99)
POTASSIUM: 4 mmol/L (ref 3.5–5.2)
Sodium: 140 mmol/L (ref 134–144)

## 2015-01-09 NOTE — Assessment & Plan Note (Signed)
Needs exam prior to entering an assisted living facility. Because of mental handicap family has been declining to do colonoscopies or pelvic exams or breast exams. Otherwise no major complaints

## 2015-01-11 ENCOUNTER — Telehealth: Payer: Self-pay | Admitting: Family Medicine

## 2015-01-12 MED ORDER — LEVOTHYROXINE SODIUM 50 MCG PO TABS: 50.0000 ug | ORAL_TABLET | Freq: Every day | ORAL | Status: DC

## 2015-01-12 NOTE — Telephone Encounter (Signed)
Discussed results with patient's caregiver.  She has not been taking Zocor for > 3 months. She d/c it due to foot pain. She questioned if she should start it back. Cholesterol panel was essentially normal. Will refer back to Dr Dettinger for advice. Will need a refill on Synthroid sent to [place] so that it can be delivered to [facility] in [place].

## 2015-01-16 ENCOUNTER — Telehealth: Payer: Self-pay | Admitting: Nurse Practitioner

## 2015-01-16 NOTE — Telephone Encounter (Signed)
Is it ok to send over a order to D/C her zocor?

## 2015-01-16 NOTE — Telephone Encounter (Signed)
Yes please send an order to discontinue her Zocor. Thank you Caryl Pina, MD [facility] 01/16/2015, 5:12 PM

## 2015-01-17 NOTE — Telephone Encounter (Signed)
Faxed sent to [facility] point to discontinue med

## 2015-01-22 ENCOUNTER — Encounter: Payer: Medicare Other | Admitting: Nurse Practitioner

## 2015-01-23 ENCOUNTER — Encounter: Payer: Self-pay | Admitting: Nurse Practitioner

## 2015-03-22 ENCOUNTER — Other Ambulatory Visit: Payer: Self-pay | Admitting: Family Medicine

## 2015-03-22 ENCOUNTER — Ambulatory Visit (INDEPENDENT_AMBULATORY_CARE_PROVIDER_SITE_OTHER): Payer: Medicare Other | Admitting: Family Medicine

## 2015-03-22 ENCOUNTER — Encounter: Payer: Self-pay | Admitting: Family Medicine

## 2015-03-22 VITALS — BP 139/74 | HR 93 | Temp 97.1°F | Ht 62.5 in | Wt 182.8 lb

## 2015-03-22 DIAGNOSIS — R0600 Dyspnea, unspecified: Secondary | ICD-10-CM | POA: Diagnosis not present

## 2015-03-22 DIAGNOSIS — Z23 Encounter for immunization: Secondary | ICD-10-CM

## 2015-03-22 DIAGNOSIS — M7989 Other specified soft tissue disorders: Secondary | ICD-10-CM | POA: Diagnosis not present

## 2015-03-22 MED ORDER — FUROSEMIDE 20 MG PO TABS: 20.0000 mg | ORAL_TABLET | Freq: Every day | ORAL | Status: DC | PRN

## 2015-03-22 NOTE — Telephone Encounter (Signed)
We have to specify a certain diet for them to follow.  I can let the family know this and that they will need to talk with Terri Shelton themselves about monitoring her diet closer if you'd like.

## 2015-03-22 NOTE — Telephone Encounter (Signed)
Please see note from [place]

## 2015-03-22 NOTE — Telephone Encounter (Signed)
Can we just do a prescription for her to not be allowed to eat extra, such is when she eats other people's meals. I don't want to take away all sweets from her we would just like to prevent her from eating 2 and 3 people's meals so that she doesn't gained so much weight so quickly. I am fine with writing a prescription for compression hose based on her bilateral lower extremity swelling, go ahead and send that. Caryl Pina, MD [facility] 03/22/2015, 2:06 PM

## 2015-03-22 NOTE — Progress Notes (Signed)
BP 139/74 mmHg  Pulse 93  Temp(Src) 97.1 F (36.2 C) (Oral)  Ht 5' 2.5" (1.588 m)  Wt 182 lb 12.8 oz (82.918 kg)  BMI 32.88 kg/m2   Subjective:    Patient ID: Terri Shelton, female    DOB: 02-13-1946, 69 y.o.   MRN: [id number]  HPI: Terri Shelton is a 69 y.o. female presenting on 03/22/2015 for Edema and Weight Gain   HPI Shortness of breath and leg swelling Patient has shortness of breath on exertion, mainly with stairs. She does not go upstairs very frequently though as she has decreased mobility. She has significant leg swelling that she takes Lasix for. She needs a refill on her Lasix. His daughter who brings her in today also admittedly reports that [place] point in her assisted living facility she is over eating a lot and that may attribute to some of her weight gain and swelling but some of it is fluid.  Relevant past medical, surgical, family and social history reviewed and updated as indicated. Interim medical history since our last visit reviewed. Allergies and medications reviewed and updated.  Review of Systems  Constitutional: Negative for fever and chills.  HENT: Negative for congestion, ear discharge and ear pain.   Eyes: Negative for redness and visual disturbance.  Respiratory: Positive for shortness of breath. Negative for chest tightness.   Cardiovascular: Positive for leg swelling. Negative for chest pain and palpitations.  Genitourinary: Negative for dysuria and difficulty urinating.  Musculoskeletal: Negative for back pain and gait problem.  Skin: Negative for rash.  Neurological: Negative for dizziness, light-headedness and headaches.  Psychiatric/Behavioral: Negative for behavioral problems and agitation.  All other systems reviewed and are negative.   Per HPI unless specifically indicated above     Medication List       This list is accurate as of: 03/22/15 10:45 AM.  Always use your most recent med list.               furosemide 20  MG tablet  Commonly known as:  LASIX  Take 1 tablet (20 mg total) by mouth daily as needed for fluid or edema.     levothyroxine 50 MCG tablet  Commonly known as:  SYNTHROID, LEVOTHROID  Take 1 tablet (50 mcg total) by mouth daily.           Objective:    BP 139/74 mmHg  Pulse 93  Temp(Src) 97.1 F (36.2 C) (Oral)  Ht 5' 2.5" (1.588 m)  Wt 182 lb 12.8 oz (82.918 kg)  BMI 32.88 kg/m2  Wt Readings from Last 3 Encounters:  03/22/15 182 lb 12.8 oz (82.918 kg)  01/08/15 163 lb (73.936 kg)  01/15/14 162 lb 12.8 oz (73.846 kg)    Physical Exam  Constitutional: She is oriented to person, place, and time. She appears well-developed and well-nourished. No distress.  Eyes: Conjunctivae and EOM are normal. Pupils are equal, round, and reactive to light.  Cardiovascular: Normal rate, regular rhythm, normal heart sounds and intact distal pulses.   No murmur heard. Pulmonary/Chest: Effort normal and breath sounds normal. No respiratory distress. She has no wheezes.  Musculoskeletal: Normal range of motion. She exhibits edema (2+). She exhibits no tenderness.  Neurological: She is alert and oriented to person, place, and time. Coordination normal.  Skin: Skin is warm and dry. No rash noted. She is not diaphoretic.  Psychiatric: She has a normal mood and affect. Her behavior is normal.  Nursing note and vitals reviewed.  Results for orders placed or performed in visit on 01/08/15  [facility]  Result Value Ref Range   Glucose 84 65 - 99 mg/dL   BUN 12 8 - 27 mg/dL   Creatinine, Ser 0.78 0.57 - 1.00 mg/dL   GFR calc non Af Amer 78 >59 mL/min/1.73   GFR calc Af Amer 90 >59 mL/min/1.73   BUN/Creatinine Ratio 15 11 - 26   Sodium 140 134 - 144 mmol/L   Potassium 4.0 3.5 - 5.2 mmol/L   Chloride 99 97 - 108 mmol/L   CO2 25 18 - 29 mmol/L   Calcium 9.3 8.7 - 10.3 mg/dL  Lipid panel  Result Value Ref Range   Cholesterol, Total 175 100 - 199 mg/dL   Triglycerides 280 (H) 0 - 149 mg/dL    HDL 36 (L) >39 mg/dL   VLDL Cholesterol Cal 56 (H) 5 - 40 mg/dL   LDL Calculated 83 0 - 99 mg/dL   Chol/HDL Ratio 4.9 (H) 0.0 - 4.4 ratio units  Thyroid Panel With TSH  Result Value Ref Range   TSH 5.530 (H) 0.450 - 4.500 uIU/mL   T4, Total 9.2 4.5 - 12.0 ug/dL   T3 Uptake Ratio 24 24 - 39 %   Free Thyroxine Index 2.2 1.2 - 4.9      Assessment & Plan:   Problem List Items Addressed This Visit    None    Visit Diagnoses    Leg swelling    -  Primary    Relevant Medications    furosemide (LASIX) 20 MG tablet    Other Relevant Orders    Echocardiogram    Dyspnea        Relevant Medications    furosemide (LASIX) 20 MG tablet    Other Relevant Orders    Echocardiogram    Encounter for immunization            Follow up plan: Return in about 6 months (around 09/19/2015), or if symptoms worsen or fail to improve, for hypothyroid.  Caryl Pina, MD [facility] 03/22/2015, 10:45 AM

## 2015-03-25 NOTE — Telephone Encounter (Signed)
That is fine, because I do not know want put her on any of those no sweet diet or anything like that. They just don't want her to gain some much extra weight by eating other people's plates next to her. Tell the family that we attempted to put a calorie limit but they said they couldn't do it.

## 2015-03-27 NOTE — Telephone Encounter (Signed)
Patients niece is aware that [place] is not able to do the 2000 calorie diet. Norh Scharlene Gloss has moved her to a different table to where the other people at the table ate all there food and she is not able to get to others plates.

## 2015-04-26 ENCOUNTER — Telehealth: Payer: Self-pay | Admitting: Family Medicine

## 2015-04-26 DIAGNOSIS — R0600 Dyspnea, unspecified: Secondary | ICD-10-CM

## 2015-04-26 DIAGNOSIS — E039 Hypothyroidism, unspecified: Secondary | ICD-10-CM

## 2015-04-26 NOTE — Telephone Encounter (Signed)
Stp's niece and she states pt was supposed to have echocardiogram and i don't see it in the chart, i see it in your notes. Did you still want that? Pt is also concerned about weight gain, niece thinks it might be related to thyroid medication.

## 2015-04-29 NOTE — Telephone Encounter (Signed)
Spoke with patient's niece, Angela Nevin, informed her that Ms. Vilorio needs to have her thyroid rechecked and that we would order the Echo and call her with the information.

## 2015-04-29 NOTE — Telephone Encounter (Signed)
Yes please order the echo, I must of forgot to drop the actual order. We had just restarted her thyroid medication when I saw her last and the levels were slightly off but that was before the medication. If she would like we can have her come in and just do a quick lab draw of a thyroid panel and I will see where her dose is currently to see if it is appropriate. Thyroid medication will not make her gain weight. The weight gain would only be if her thyroid medication was too low and we would have to increase it then. We will recheck this in the labs.

## 2015-05-02 ENCOUNTER — Other Ambulatory Visit (INDEPENDENT_AMBULATORY_CARE_PROVIDER_SITE_OTHER): Payer: Medicare Other

## 2015-05-02 DIAGNOSIS — R635 Abnormal weight gain: Secondary | ICD-10-CM | POA: Diagnosis not present

## 2015-05-03 ENCOUNTER — Other Ambulatory Visit (HOSPITAL_COMMUNITY): Payer: Medicare Other

## 2015-05-03 LAB — THYROID PANEL WITH TSH
Free Thyroxine Index: 2.3 (ref 1.2–4.9)
T3 Uptake Ratio: 26 % (ref 24–39)
T4, Total: 9 ug/dL (ref 4.5–12.0)
TSH: 2.12 u[IU]/mL (ref 0.450–4.500)

## 2015-05-06 ENCOUNTER — Telehealth: Payer: Self-pay | Admitting: Nurse Practitioner

## 2015-05-07 ENCOUNTER — Ambulatory Visit (HOSPITAL_COMMUNITY): Payer: Medicare Other

## 2015-05-14 ENCOUNTER — Ambulatory Visit (HOSPITAL_COMMUNITY)
Admission: RE | Admit: 2015-05-14 | Discharge: 2015-05-14 | Disposition: A | Discharge status: Home / Self Care | Payer: Medicare Other | Source: Ambulatory Visit | Attending: Family Medicine | Admitting: Family Medicine

## 2015-05-14 DIAGNOSIS — M7989 Other specified soft tissue disorders: Secondary | ICD-10-CM

## 2015-05-14 DIAGNOSIS — E785 Hyperlipidemia, unspecified: Secondary | ICD-10-CM | POA: Insufficient documentation

## 2015-05-14 DIAGNOSIS — R0600 Dyspnea, unspecified: Secondary | ICD-10-CM

## 2015-05-14 DIAGNOSIS — R0609 Other forms of dyspnea: Secondary | ICD-10-CM | POA: Diagnosis not present

## 2015-05-21 ENCOUNTER — Other Ambulatory Visit (HOSPITAL_COMMUNITY): Payer: Medicare Other

## 2015-07-17 ENCOUNTER — Encounter: Payer: Self-pay | Admitting: Nurse Practitioner

## 2015-07-17 ENCOUNTER — Ambulatory Visit (INDEPENDENT_AMBULATORY_CARE_PROVIDER_SITE_OTHER): Payer: Medicare Other

## 2015-07-17 ENCOUNTER — Ambulatory Visit (INDEPENDENT_AMBULATORY_CARE_PROVIDER_SITE_OTHER): Payer: Medicare Other | Admitting: Nurse Practitioner

## 2015-07-17 VITALS — BP 145/80 | HR 70 | Temp 96.9°F | Ht 62.5 in | Wt 194.2 lb

## 2015-07-17 DIAGNOSIS — R609 Edema, unspecified: Secondary | ICD-10-CM | POA: Diagnosis not present

## 2015-07-17 DIAGNOSIS — R0602 Shortness of breath: Secondary | ICD-10-CM

## 2015-07-17 DIAGNOSIS — R0600 Dyspnea, unspecified: Secondary | ICD-10-CM

## 2015-07-17 DIAGNOSIS — I509 Heart failure, unspecified: Secondary | ICD-10-CM | POA: Diagnosis not present

## 2015-07-17 DIAGNOSIS — M7989 Other specified soft tissue disorders: Secondary | ICD-10-CM | POA: Diagnosis not present

## 2015-07-17 MED ORDER — FUROSEMIDE 20 MG PO TABS: 20.0000 mg | ORAL_TABLET | Freq: Every day | ORAL | Status: AC

## 2015-07-17 NOTE — Progress Notes (Signed)
   Subjective:    Patient ID: Terri Shelton, female    DOB: 11-11-1945, 70 y.o.   MRN: [id number]  HPI Patient is brought in today by her sister in law- She says that patient started on Thyroid medicine this past August and since then she has gained a lot of weight.  She is not a big eater. They say that she has started having peripheral edema. She wears compression hose all the time but still has swelling.She seems to get short of breath very quickly.She is a resident at [facility].She has lasix 20mg  rx but they do not give it to her unless she needs it   Review of Systems  Constitutional: Negative.   HENT: Negative.   Respiratory: Positive for shortness of breath.   Cardiovascular: Positive for leg swelling.  Gastrointestinal: Negative.   Genitourinary: Negative.   Neurological: Negative.   Psychiatric/Behavioral: Negative.   All other systems reviewed and are negative.      Objective:   Physical Exam  Constitutional: She is oriented to person, place, and time. She appears well-developed and well-nourished. No distress.  Cardiovascular: Normal rate, regular rhythm and normal heart sounds.   Pulmonary/Chest: Effort normal and breath sounds normal.  Neurological: She is alert and oriented to person, place, and time.  Skin: Skin is warm.  Psychiatric: She has a normal mood and affect. Her behavior is normal. Judgment and thought content normal.   BP 145/80 mmHg  Pulse 70  Temp(Src) 96.9 F (36.1 C) (Oral)  Ht 5' 2.5" (1.588 m)  Wt 194 lb 3.2 oz (88.089 kg)  BMI 34.93 kg/m2  Chest x ray- opacities bil lower lobes consistent with CHF-Preliminary reading by Ronnald Collum, FNP  [facility]       Assessment & Plan:   1. SOB (shortness of breath)   2. Peripheral edema   3. Acute congestive heart failure, unspecified congestive heart failure type (Johnmatthew Solorio)   4. Leg swelling   5. Dyspnea    Change lasix to 20mg  daily Daily weights RTO prn  Mary-Margaret Hassell Done, FNP

## 2015-07-17 NOTE — Patient Instructions (Addendum)
Thank you for allowing [place] to care for you today. We strive to provide exceptional quality and compassionate care. Please let us know how we are doing and how we can help serve you better by filling out the survey that you receive from [facility].   Heart Failure Heart failure is a condition in which the heart has trouble pumping blood. This means your heart does not pump blood efficiently for your body to work well. In some cases of heart failure, fluid may back up into your lungs or you may have swelling (edema) in your lower legs. Heart failure is usually a long-term (chronic) condition. It is important for you to take good care of yourself and follow your health care provider's treatment plan. CAUSES  Some health conditions can cause heart failure. Those health conditions include:  High blood pressure (hypertension). Hypertension causes the heart muscle to work harder than normal. When pressure in the blood vessels is high, the heart needs to pump (contract) with more force in order to circulate blood throughout the body. High blood pressure eventually causes the heart to become stiff and weak.  Coronary artery disease (CAD). CAD is the buildup of cholesterol and fat (plaque) in the arteries of the heart. The blockage in the arteries deprives the heart muscle of oxygen and blood. This can cause chest pain and may lead to a heart attack. High blood pressure can also contribute to CAD.  Heart attack (myocardial infarction). A heart attack occurs when one or more arteries in the heart become blocked. The loss of oxygen damages the muscle tissue of the heart. When this happens, part of the heart muscle dies. The injured tissue does not contract as well and weakens the heart's ability to pump blood.  Abnormal heart valves. When the heart valves do not open and close properly, it can cause heart failure. This makes the heart muscle pump harder to keep the blood flowing.  Heart muscle disease  (cardiomyopathy or myocarditis). Heart muscle disease is damage to the heart muscle from a variety of causes. These can include drug or alcohol abuse, infections, or unknown reasons. These can increase the risk of heart failure.  Lung disease. Lung disease makes the heart work harder because the lungs do not work properly. This can cause a strain on the heart, leading it to fail.  Diabetes. Diabetes increases the risk of heart failure. High blood sugar contributes to high fat (lipid) levels in the blood. Diabetes can also cause slow damage to tiny blood vessels that carry important nutrients to the heart muscle. When the heart does not get enough oxygen and food, it can cause the heart to become weak and stiff. This leads to a heart that does not contract efficiently.  Other conditions can contribute to heart failure. These include abnormal heart rhythms, thyroid problems, and low blood counts (anemia). Certain unhealthy behaviors can increase the risk of heart failure, including:  Being overweight.  Smoking or chewing tobacco.  Eating foods high in fat and cholesterol.  Abusing illicit drugs or alcohol.  Lacking physical activity. SYMPTOMS  Heart failure symptoms may vary and can be hard to detect. Symptoms may include:  Shortness of breath with activity, such as climbing stairs.  Persistent cough.  Swelling of the feet, ankles, legs, or abdomen.  Unexplained weight gain.  Difficulty breathing when lying flat (orthopnea).  Waking from sleep because of the need to sit up and get more air.  Rapid heartbeat.  Fatigue and loss of  energy.  Feeling light-headed, dizzy, or close to fainting.  Loss of appetite.  Nausea.  Increased urination during the night (nocturia). DIAGNOSIS  A diagnosis of heart failure is based on your history, symptoms, physical examination, and diagnostic tests. Diagnostic tests for heart failure may  include:  Echocardiography.  Electrocardiography.  Chest X-ray.  Blood tests.  Exercise stress test.  Cardiac angiography.  Radionuclide scans. TREATMENT  Treatment is aimed at managing the symptoms of heart failure. Medicines, behavioral changes, or surgical intervention may be necessary to treat heart failure.  Medicines to help treat heart failure may include:  Angiotensin-converting enzyme (ACE) inhibitors. This type of medicine blocks the effects of a blood protein called angiotensin-converting enzyme. ACE inhibitors relax (dilate) the blood vessels and help lower blood pressure.  Angiotensin receptor blockers (ARBs). This type of medicine blocks the actions of a blood protein called angiotensin. Angiotensin receptor blockers dilate the blood vessels and help lower blood pressure.  Water pills (diuretics). Diuretics cause the kidneys to remove salt and water from the blood. The extra fluid is removed through urination. This loss of extra fluid lowers the volume of blood the heart pumps.  Beta blockers. These prevent the heart from beating too fast and improve heart muscle strength.  Digitalis. This increases the force of the heartbeat.  Healthy behavior changes include:  Obtaining and maintaining a healthy weight.  Stopping smoking or chewing tobacco.  Eating heart-healthy foods.  Limiting or avoiding alcohol.  Stopping illicit drug use.  Physical activity as directed by your health care provider.  Surgical treatment for heart failure may include:  A procedure to open blocked arteries, repair damaged heart valves, or remove damaged heart muscle tissue.  A pacemaker to improve heart muscle function and control certain abnormal heart rhythms.  An internal cardioverter defibrillator to treat certain serious abnormal heart rhythms.  A left ventricular assist device (LVAD) to assist the pumping ability of the heart. HOME CARE INSTRUCTIONS   Take medicines only  as directed by your health care provider. Medicines are important in reducing the workload of your heart, slowing the progression of heart failure, and improving your symptoms.  Do not stop taking your medicine unless directed by your health care provider.  Do not skip any dose of medicine.  Refill your prescriptions before you run out of medicine. Your medicines are needed every day.  Engage in moderate physical activity if directed by your health care provider. Moderate physical activity can benefit some people. The elderly and people with severe heart failure should consult with a health care provider for physical activity recommendations.  Eat heart-healthy foods. Food choices should be free of trans fat and low in saturated fat, cholesterol, and salt (sodium). Healthy choices include fresh or frozen fruits and vegetables, fish, lean meats, legumes, fat-free or low-fat dairy products, and whole grain or high fiber foods. Talk to a dietitian to learn more about heart-healthy foods.  Limit sodium if directed by your health care provider. Sodium restriction may reduce symptoms of heart failure in some people. Talk to a dietitian to learn more about heart-healthy seasonings.  Use healthy cooking methods. Healthy cooking methods include roasting, grilling, broiling, baking, poaching, steaming, or stir-frying. Talk to a dietitian to learn more about healthy cooking methods.  Limit fluids if directed by your health care provider. Fluid restriction may reduce symptoms of heart failure in some people.  Weigh yourself every day. Daily weights are important in the early recognition of excess fluid. You should weigh  yourself every morning after you urinate and before you eat breakfast. Wear the same amount of clothing each time you weigh yourself. Record your daily weight. Provide your health care provider with your weight record.  Monitor and record your blood pressure if directed by your health care  provider.  Check your pulse if directed by your health care provider.  Lose weight if directed by your health care provider. Weight loss may reduce symptoms of heart failure in some people.  Stop smoking or chewing tobacco. Nicotine makes your heart work harder by causing your blood vessels to constrict. Do not use nicotine gum or patches before talking to your health care provider.  Keep all follow-up visits as directed by your health care provider. This is important.  Limit alcohol intake to no more than 1 drink per day for nonpregnant women and 2 drinks per day for men. One drink equals 12 ounces of beer, 5 ounces of wine, or 1 ounces of hard liquor. Drinking more than that is harmful to your heart. Tell your health care provider if you drink alcohol several times a week. Talk with your health care provider about whether alcohol is safe for you. If your heart has already been damaged by alcohol or you have severe heart failure, drinking alcohol should be stopped completely.  Stop illicit drug use.  Stay up-to-date with immunizations. It is especially important to prevent respiratory infections through current pneumococcal and influenza immunizations.  Manage other health conditions such as hypertension, diabetes, thyroid disease, or abnormal heart rhythms as directed by your health care provider.  Learn to manage stress.  Plan rest periods when fatigued.  Learn strategies to manage high temperatures. If the weather is extremely hot:  Avoid vigorous physical activity.  Use air conditioning or fans or seek a cooler location.  Avoid caffeine and alcohol.  Wear loose-fitting, lightweight, and light-colored clothing.  Learn strategies to manage cold temperatures. If the weather is extremely cold:  Avoid vigorous physical activity.  Layer clothes.  Wear mittens or gloves, a hat, and a scarf when going outside.  Avoid alcohol.  Obtain ongoing education and support as  needed.  Participate in or seek rehabilitation as needed to maintain or improve independence and quality of life. SEEK MEDICAL CARE IF:   You have a rapid weight gain.  You have increasing shortness of breath that is unusual for you.  You are unable to participate in your usual physical activities.  You tire easily.  You cough more than normal, especially with physical activity.  You have any or more swelling in areas such as your hands, feet, ankles, or abdomen.  You are unable to sleep because it is hard to breathe.  You feel like your heart is beating fast (palpitations).  You become dizzy or light-headed upon standing up. SEEK IMMEDIATE MEDICAL CARE IF:   You have difficulty breathing.  There is a change in mental status such as decreased alertness or difficulty with concentration.  You have a pain or discomfort in your chest.  You have an episode of fainting (syncope). MAKE SURE YOU:   Understand these instructions.  Will watch your condition.  Will get help right away if you are not doing well or get worse.   This information is not intended to replace advice given to you by your health care provider. Make sure you discuss any questions you have with your health care provider.   Document Released: 05/04/2005 Document Revised: 09/18/2014 Document Reviewed: 06/03/2012 Elsevier Interactive  Patient Education 2016 [employer].

## 2015-07-18 LAB — CMP14+EGFR
ALBUMIN: 4.1 g/dL (ref 3.6–4.8)
ALT: 15 IU/L (ref 0–32)
AST: 19 IU/L (ref 0–40)
Albumin/Globulin Ratio: 1.5 (ref 1.1–2.5)
Alkaline Phosphatase: 118 IU/L — ABNORMAL HIGH (ref 39–117)
BUN / CREAT RATIO: 19 (ref 11–26)
BUN: 11 mg/dL (ref 8–27)
CALCIUM: 8.9 mg/dL (ref 8.7–10.3)
CHLORIDE: 101 mmol/L (ref 96–106)
CO2: 22 mmol/L (ref 18–29)
CREATININE: 0.59 mg/dL (ref 0.57–1.00)
GFR, EST AFRICAN AMERICAN: 108 mL/min/{1.73_m2} (ref >59)
GFR, EST NON AFRICAN AMERICAN: 94 mL/min/{1.73_m2} (ref >59)
GLUCOSE: 104 mg/dL — AB (ref 65–99)
Globulin, Total: 2.7 g/dL (ref 1.5–4.5)
Potassium: 4 mmol/L (ref 3.5–5.2)
Sodium: 142 mmol/L (ref 134–144)
TOTAL PROTEIN: 6.8 g/dL (ref 6.0–8.5)

## 2015-07-18 LAB — BRAIN NATRIURETIC PEPTIDE: BNP: 23.7 pg/mL (ref 0.0–100.0)

## 2015-07-23 ENCOUNTER — Ambulatory Visit: Payer: Medicare Other | Admitting: Nurse Practitioner

## 2015-07-25 ENCOUNTER — Telehealth: Payer: Self-pay | Admitting: Nurse Practitioner

## 2015-07-25 NOTE — Telephone Encounter (Signed)
Talked with Angela Nevin again & gave her Konrad Saha recommendations. She will let us know if her edema does not get any better.

## 2015-07-25 NOTE — Telephone Encounter (Signed)
Pt is getting lasix now daily at [facility] but still having peripheral edema. Wanting to know if Lasix Rx can be changed to help and if it can be written to take compression hose off for a couple of days to see if this helps too since they are so tight with the weight gain & edema

## 2015-07-25 NOTE — Telephone Encounter (Signed)
The compression hose will work in conjunction with lasix- she does not need o wear compression hose at night- only during the day when she is up and about- we do not wantto give her to much lasix and drain her and cause her to have a fall.

## 2015-10-03 ENCOUNTER — Ambulatory Visit (INDEPENDENT_AMBULATORY_CARE_PROVIDER_SITE_OTHER): Payer: Medicare Other | Admitting: Family

## 2015-10-03 ENCOUNTER — Encounter: Payer: Self-pay | Admitting: Family

## 2015-10-03 ENCOUNTER — Ambulatory Visit: Payer: Medicare Other | Admitting: Family Medicine

## 2015-10-03 VITALS — BP 130/85 | HR 91 | Temp 98.2°F | Ht 62.5 in | Wt 197.6 lb

## 2015-10-03 DIAGNOSIS — I509 Heart failure, unspecified: Secondary | ICD-10-CM

## 2015-10-03 DIAGNOSIS — E038 Other specified hypothyroidism: Secondary | ICD-10-CM

## 2015-10-03 DIAGNOSIS — E034 Atrophy of thyroid (acquired): Secondary | ICD-10-CM

## 2015-10-03 DIAGNOSIS — IMO0001 Reserved for inherently not codable concepts without codable children: Secondary | ICD-10-CM | POA: Insufficient documentation

## 2015-10-03 DIAGNOSIS — R0602 Shortness of breath: Secondary | ICD-10-CM

## 2015-10-03 DIAGNOSIS — R609 Edema, unspecified: Secondary | ICD-10-CM

## 2015-10-03 DIAGNOSIS — E669 Obesity, unspecified: Secondary | ICD-10-CM | POA: Insufficient documentation

## 2015-10-03 MED ORDER — LISINOPRIL 2.5 MG PO TABS: 2.5000 mg | ORAL_TABLET | Freq: Every day | ORAL | Status: AC

## 2015-10-03 NOTE — Progress Notes (Addendum)
   Subjective:    Patient ID: Terri Shelton, female    DOB: [birth year], 70 y.o.   MRN: [id number]  HPI Pt presents to the office today with her guardian with bilateral leg swelling. PT has been seen in the office multiple times for SOB, weight gain, dyspnea, and edema. Pt's guardian states she believes all this is related to her thyroid medications, because she did not have any of these symptoms until she started on levothyroxine. Pt has been diagnosed with CHF, but has not seen a Cardiologists, but has had a negative EKG. Pt's guardian states she has has had 40 lb gain since August. Her guardian states she took off her compression hose because they were leaving "marks".    Review of Systems  Respiratory: Positive for shortness of breath (when walking).   Cardiovascular: Positive for leg swelling. Negative for chest pain and palpitations.  All other systems reviewed and are negative.      Objective:   Physical Exam  Constitutional: She is oriented to person, place, and time. She appears well-developed and well-nourished. No distress.  HENT:  Head: Normocephalic and atraumatic.  Eyes: Pupils are equal, round, and reactive to light.  Neck: Normal range of motion. Neck supple. No thyromegaly present.  Cardiovascular: Normal rate, regular rhythm, normal heart sounds and intact distal pulses.   No murmur heard. Pulmonary/Chest: Effort normal and breath sounds normal. No respiratory distress. She has no wheezes.  Abdominal: Soft. Bowel sounds are normal. She exhibits no distension. There is no tenderness.  Musculoskeletal: Normal range of motion. She exhibits edema (trace in BLE). She exhibits no tenderness.  Neurological: She is alert and oriented to person, place, and time. She has normal reflexes. No cranial nerve deficit.  Skin: Skin is warm and dry.  Psychiatric: She has a normal mood and affect. Her behavior is normal. Judgment and thought content normal.  Vitals reviewed.   BP  130/85 mmHg  Pulse 91  Temp(Src) 98.2 F (36.8 C) (Oral)  Ht 5' 2.5" (1.588 m)  Wt 197 lb 9.6 oz (89.631 kg)  BMI 35.54 kg/m2       Assessment & Plan:  1. SOB (shortness of breath) on exertion - Ambulatory referral to Cardiology  2. Congestive heart failure, unspecified congestive heart failure chronicity, unspecified congestive heart failure type ([place]) - Ambulatory referral to Cardiology - lisinopril (ZESTRIL) 2.5 MG tablet; Take 1 tablet (2.5 mg total) by mouth daily.  Dispense: 90 tablet; Refill: 1  3. Peripheral edema - Ambulatory referral to Cardiology  4. Obesity, Class II, BMI 35-39.9, with comorbidity ([place]) - Ambulatory referral to Cardiology  5. Hypothyroidism due to acquired atrophy of thyroid - Ambulatory referral to Cardiology - Thyroid Panel With TSH  Labs pending to check Thyroid- If stable told guardian we would try to stop medication to see if symptoms improve? Cardiologists referral pending for CHF RTO after Cardiologists appt  Continue lasix and compression hose, low salt diet  Evelina Dun, FNP

## 2015-10-03 NOTE — Patient Instructions (Signed)

## 2015-10-04 LAB — THYROID PANEL WITH TSH
FREE THYROXINE INDEX: 2.3 (ref 1.2–4.9)
T3 Uptake Ratio: 24 % (ref 24–39)
T4, Total: 9.5 ug/dL (ref 4.5–12.0)
TSH: 3.6 u[IU]/mL (ref 0.450–4.500)

## 2015-10-11 ENCOUNTER — Telehealth: Payer: Self-pay | Admitting: Family

## 2015-10-11 NOTE — Telephone Encounter (Signed)
Left message

## 2015-10-11 NOTE — Telephone Encounter (Signed)
RX was written to stop and nurse faxed

## 2015-10-18 ENCOUNTER — Ambulatory Visit: Payer: Medicare Other | Admitting: Cardiovascular Disease

## 2015-11-13 ENCOUNTER — Ambulatory Visit (INDEPENDENT_AMBULATORY_CARE_PROVIDER_SITE_OTHER): Payer: Medicare Other | Admitting: Cardiovascular Disease

## 2015-11-13 ENCOUNTER — Encounter: Payer: Self-pay | Admitting: Cardiovascular Disease

## 2015-11-13 VITALS — BP 138/80 | HR 89 | Ht 62.0 in | Wt 200.0 lb

## 2015-11-13 DIAGNOSIS — I10 Essential (primary) hypertension: Secondary | ICD-10-CM

## 2015-11-13 DIAGNOSIS — R600 Localized edema: Secondary | ICD-10-CM | POA: Diagnosis not present

## 2015-11-13 DIAGNOSIS — Z136 Encounter for screening for cardiovascular disorders: Secondary | ICD-10-CM | POA: Diagnosis not present

## 2015-11-13 NOTE — Progress Notes (Signed)
Patient ID: Terri Shelton, female   DOB: 16-Feb-1946, 70 y.o.   MRN: [id number]       CARDIOLOGY CONSULT NOTE  Patient ID: Terri Shelton MRN: [id number] DOB/AGE: 06-20-1945 70 y.o.  Admit date: (Not on file) Primary Physician: Worthy Rancher, MD Referring Physician: Evelina Dun FNP  Reason for Consultation: SOB, CHF  HPI: The patient is a 70 year old woman with a history of hypertension referred for the evaluation of congestive heart failure. However, echocardiogram on 05/14/15 showed vigorous left ventricular systolic function, LVEF [id number], with grade 1 diastolic dysfunction. Dr. Warrick Parisian commented that based on these results, her heart was not the source of her symptoms. BNP was normal at 23.7. 3/1 labs showed creatinine 0.59. TSH 3.6 on 5/18. ECG performed in the office today which I personally interpreted demonstrated sinus rhythm with late R-wave transition.  She is here with her caregiver. She has a cognitive disability. Her caregiver says she did not put on weight till she was at a long-term care facility where her diet was not stricken monitored. She used to weigh 150 pounds and now weighs 200 pounds. She denies chest pain. She had shortness of breath which occurred with weight gain. Her caregiver said her leg swelling has markedly improved.   Allergies  Allergen Reactions  . Amoxicillin   . Penicillins Other (See Comments)    Pass out/shaking  . Sulfa Drugs Cross Reactors Diarrhea and Other (See Comments)    disoriented  . Sulfur Diarrhea    Current Outpatient Prescriptions  Medication Sig Dispense Refill  . furosemide (LASIX) 20 MG tablet Take 1 tablet (20 mg total) by mouth daily. 30 tablet 3  . lisinopril (ZESTRIL) 2.5 MG tablet Take 1 tablet (2.5 mg total) by mouth daily. 90 tablet 1  . Multiple Vitamin (MULTI VITAMIN DAILY PO) Take by mouth.     No current facility-administered medications for this visit.    Past Medical History  Diagnosis  Date  . Mentally disabled   . Allergy   . Arthritis   . Hyperlipidemia   . Thyroid disease     Past Surgical History  Procedure Laterality Date  . Knee surgery    . Knee surgery      Social History   Social History  . Marital Status: Single    Spouse Name: N/A  . Number of Children: N/A  . Years of Education: N/A   Occupational History  . Not on file.   Social History Main Topics  . Smoking status: Never Smoker   . Smokeless tobacco: Never Used  . Alcohol Use: No  . Drug Use: No  . Sexual Activity: No   Other Topics Concern  . Not on file   Social History Narrative     No family history of premature CAD in 1st degree relatives.  Prior to Admission medications   Medication Sig Start Date End Date Taking? Authorizing Provider  furosemide (LASIX) 20 MG tablet Take 1 tablet (20 mg total) by mouth daily. 07/17/15  Yes Mary-Margaret Hassell Done, FNP  lisinopril (ZESTRIL) 2.5 MG tablet Take 1 tablet (2.5 mg total) by mouth daily. 10/03/15  Yes Sharion Balloon, FNP  Multiple Vitamin (MULTI VITAMIN DAILY PO) Take by mouth.   Yes Historical Provider, MD     Review of systems complete and found to be negative unless listed above in HPI     Physical exam Blood pressure 138/80, pulse 89, height 5\' 2"  (1.575 m), weight 200 lb (90.719 kg), SpO2  94 %. General: NAD Neck: No JVD, no thyromegaly or thyroid nodule.  Lungs: Clear to auscultation bilaterally with normal respiratory effort. CV: Nondisplaced PMI. Regular rate and rhythm, normal S1/S2, no S3/S4, no murmur.  Trivial peripheral edema.   Abdomen: Soft, obese.  Skin: Intact without lesions or rashes.  Neurologic: Alert.  Psych: Normal affect. Extremities: No clubbing or cyanosis.  HEENT: Normal.   ECG: Most recent ECG reviewed.  Labs:   Lab Results  Component Value Date   WBC 9.2 01/11/2011   HGB 12.0 01/11/2011   HCT 35.5* 01/11/2011   MCV 89.4 01/11/2011   PLT 233 01/11/2011   No results for input(s): NA,  K, CL, CO2, BUN, CREATININE, CALCIUM, PROT, BILITOT, ALKPHOS, ALT, AST, GLUCOSE in the last 168 hours.  Invalid input(s): LABALBU No results found for: CKTOTAL, CKMB, CKMBINDEX, TROPONINI  Lab Results  Component Value Date   CHOL 175 01/08/2015   CHOL 213* 01/15/2014   CHOL 171 09/20/2012   Lab Results  Component Value Date   HDL 36* 01/08/2015   HDL 46 01/15/2014   HDL 39* 09/20/2012   Lab Results  Component Value Date   LDLCALC 83 01/08/2015   LDLCALC 138* 01/15/2014   LDLCALC 104* 09/20/2012   Lab Results  Component Value Date   TRIG 280* 01/08/2015   TRIG 146 01/15/2014   TRIG 141 09/20/2012   Lab Results  Component Value Date   CHOLHDL 4.9* 01/08/2015   CHOLHDL 4.6* 01/15/2014   No results found for: LDLDIRECT       Studies: No results found.  ASSESSMENT AND PLAN:  1. Bilateral leg edema: Given her normal cardiac function and normal BNP, her leg swelling is likely due to obesity and impaired venous return. I would recommend continuing Lasix and controlling her blood pressure. No further cardiovascular testing is warranted. She should be on a low-sodium diet as well. I also agree with compression stockings.  2. Essential HTN: Controlled. No changes.  Dispo: fu prn.   Signed: Kate Sable, M.D., F.A.C.C.  11/13/2015, 1:24 PM

## 2015-11-13 NOTE — Patient Instructions (Signed)
Your physician recommends that you schedule a follow-up appointment in: AS NEEDED WITH DR KONESWARAN  Your physician recommends that you continue on your current medications as directed. Please refer to the Current Medication list given to you today.  Thank you for choosing [facility]!!    

## 2016-01-23 DIAGNOSIS — F79 Unspecified intellectual disabilities: Secondary | ICD-10-CM | POA: Diagnosis not present

## 2016-01-23 DIAGNOSIS — Z6835 Body mass index (BMI) 35.0-35.9, adult: Secondary | ICD-10-CM | POA: Diagnosis not present

## 2016-01-23 DIAGNOSIS — M6281 Muscle weakness (generalized): Secondary | ICD-10-CM | POA: Diagnosis not present

## 2016-01-23 DIAGNOSIS — R600 Localized edema: Secondary | ICD-10-CM | POA: Diagnosis not present

## 2016-01-23 DIAGNOSIS — I10 Essential (primary) hypertension: Secondary | ICD-10-CM | POA: Diagnosis not present

## 2016-01-27 DIAGNOSIS — R600 Localized edema: Secondary | ICD-10-CM | POA: Diagnosis not present

## 2016-01-27 DIAGNOSIS — Z6835 Body mass index (BMI) 35.0-35.9, adult: Secondary | ICD-10-CM | POA: Diagnosis not present

## 2016-01-27 DIAGNOSIS — I10 Essential (primary) hypertension: Secondary | ICD-10-CM | POA: Diagnosis not present

## 2016-01-27 DIAGNOSIS — M6281 Muscle weakness (generalized): Secondary | ICD-10-CM | POA: Diagnosis not present

## 2016-01-27 DIAGNOSIS — F79 Unspecified intellectual disabilities: Secondary | ICD-10-CM | POA: Diagnosis not present

## 2016-01-29 DIAGNOSIS — R600 Localized edema: Secondary | ICD-10-CM | POA: Diagnosis not present

## 2016-01-29 DIAGNOSIS — Z6835 Body mass index (BMI) 35.0-35.9, adult: Secondary | ICD-10-CM | POA: Diagnosis not present

## 2016-01-29 DIAGNOSIS — F79 Unspecified intellectual disabilities: Secondary | ICD-10-CM | POA: Diagnosis not present

## 2016-01-29 DIAGNOSIS — I10 Essential (primary) hypertension: Secondary | ICD-10-CM | POA: Diagnosis not present

## 2016-01-29 DIAGNOSIS — M6281 Muscle weakness (generalized): Secondary | ICD-10-CM | POA: Diagnosis not present

## 2016-02-03 DIAGNOSIS — R600 Localized edema: Secondary | ICD-10-CM | POA: Diagnosis not present

## 2016-02-03 DIAGNOSIS — M6281 Muscle weakness (generalized): Secondary | ICD-10-CM | POA: Diagnosis not present

## 2016-02-03 DIAGNOSIS — I10 Essential (primary) hypertension: Secondary | ICD-10-CM | POA: Diagnosis not present

## 2016-02-03 DIAGNOSIS — Z6835 Body mass index (BMI) 35.0-35.9, adult: Secondary | ICD-10-CM | POA: Diagnosis not present

## 2016-02-03 DIAGNOSIS — F79 Unspecified intellectual disabilities: Secondary | ICD-10-CM | POA: Diagnosis not present

## 2016-02-05 DIAGNOSIS — M6281 Muscle weakness (generalized): Secondary | ICD-10-CM | POA: Diagnosis not present

## 2016-02-05 DIAGNOSIS — Z6835 Body mass index (BMI) 35.0-35.9, adult: Secondary | ICD-10-CM | POA: Diagnosis not present

## 2016-02-05 DIAGNOSIS — F79 Unspecified intellectual disabilities: Secondary | ICD-10-CM | POA: Diagnosis not present

## 2016-02-05 DIAGNOSIS — R600 Localized edema: Secondary | ICD-10-CM | POA: Diagnosis not present

## 2016-02-05 DIAGNOSIS — I10 Essential (primary) hypertension: Secondary | ICD-10-CM | POA: Diagnosis not present

## 2016-02-11 DIAGNOSIS — F79 Unspecified intellectual disabilities: Secondary | ICD-10-CM | POA: Diagnosis not present

## 2016-02-11 DIAGNOSIS — M6281 Muscle weakness (generalized): Secondary | ICD-10-CM | POA: Diagnosis not present

## 2016-02-11 DIAGNOSIS — I10 Essential (primary) hypertension: Secondary | ICD-10-CM | POA: Diagnosis not present

## 2016-02-11 DIAGNOSIS — Z6835 Body mass index (BMI) 35.0-35.9, adult: Secondary | ICD-10-CM | POA: Diagnosis not present

## 2016-02-11 DIAGNOSIS — R600 Localized edema: Secondary | ICD-10-CM | POA: Diagnosis not present

## 2016-02-13 DIAGNOSIS — M6281 Muscle weakness (generalized): Secondary | ICD-10-CM | POA: Diagnosis not present

## 2016-02-13 DIAGNOSIS — Z6835 Body mass index (BMI) 35.0-35.9, adult: Secondary | ICD-10-CM | POA: Diagnosis not present

## 2016-02-13 DIAGNOSIS — I10 Essential (primary) hypertension: Secondary | ICD-10-CM | POA: Diagnosis not present

## 2016-02-13 DIAGNOSIS — R600 Localized edema: Secondary | ICD-10-CM | POA: Diagnosis not present

## 2016-02-13 DIAGNOSIS — F79 Unspecified intellectual disabilities: Secondary | ICD-10-CM | POA: Diagnosis not present

## 2016-02-15 ENCOUNTER — Ambulatory Visit (INDEPENDENT_AMBULATORY_CARE_PROVIDER_SITE_OTHER): Payer: Medicare Other | Admitting: Family Medicine

## 2016-02-15 DIAGNOSIS — Z9181 History of falling: Secondary | ICD-10-CM | POA: Diagnosis not present

## 2016-02-15 DIAGNOSIS — I10 Essential (primary) hypertension: Secondary | ICD-10-CM | POA: Diagnosis not present

## 2016-02-15 DIAGNOSIS — R600 Localized edema: Secondary | ICD-10-CM

## 2016-02-15 DIAGNOSIS — M6281 Muscle weakness (generalized): Secondary | ICD-10-CM | POA: Diagnosis not present

## 2016-02-15 DIAGNOSIS — Z6835 Body mass index (BMI) 35.0-35.9, adult: Secondary | ICD-10-CM | POA: Diagnosis not present

## 2016-02-15 DIAGNOSIS — F79 Unspecified intellectual disabilities: Secondary | ICD-10-CM

## 2016-02-17 DIAGNOSIS — M6281 Muscle weakness (generalized): Secondary | ICD-10-CM | POA: Diagnosis not present

## 2016-02-17 DIAGNOSIS — I10 Essential (primary) hypertension: Secondary | ICD-10-CM | POA: Diagnosis not present

## 2016-02-17 DIAGNOSIS — F79 Unspecified intellectual disabilities: Secondary | ICD-10-CM | POA: Diagnosis not present

## 2016-02-17 DIAGNOSIS — Z6835 Body mass index (BMI) 35.0-35.9, adult: Secondary | ICD-10-CM | POA: Diagnosis not present

## 2016-02-17 DIAGNOSIS — R600 Localized edema: Secondary | ICD-10-CM | POA: Diagnosis not present

## 2016-02-21 DIAGNOSIS — F79 Unspecified intellectual disabilities: Secondary | ICD-10-CM | POA: Diagnosis not present

## 2016-02-21 DIAGNOSIS — I10 Essential (primary) hypertension: Secondary | ICD-10-CM | POA: Diagnosis not present

## 2016-02-21 DIAGNOSIS — R600 Localized edema: Secondary | ICD-10-CM | POA: Diagnosis not present

## 2016-02-21 DIAGNOSIS — Z6835 Body mass index (BMI) 35.0-35.9, adult: Secondary | ICD-10-CM | POA: Diagnosis not present

## 2016-02-21 DIAGNOSIS — M6281 Muscle weakness (generalized): Secondary | ICD-10-CM | POA: Diagnosis not present

## 2016-02-25 DIAGNOSIS — R600 Localized edema: Secondary | ICD-10-CM | POA: Diagnosis not present

## 2016-02-25 DIAGNOSIS — I10 Essential (primary) hypertension: Secondary | ICD-10-CM | POA: Diagnosis not present

## 2016-02-25 DIAGNOSIS — Z6835 Body mass index (BMI) 35.0-35.9, adult: Secondary | ICD-10-CM | POA: Diagnosis not present

## 2016-02-25 DIAGNOSIS — F79 Unspecified intellectual disabilities: Secondary | ICD-10-CM | POA: Diagnosis not present

## 2016-02-25 DIAGNOSIS — M6281 Muscle weakness (generalized): Secondary | ICD-10-CM | POA: Diagnosis not present

## 2016-02-26 DIAGNOSIS — M6281 Muscle weakness (generalized): Secondary | ICD-10-CM | POA: Diagnosis not present

## 2016-02-26 DIAGNOSIS — I10 Essential (primary) hypertension: Secondary | ICD-10-CM | POA: Diagnosis not present

## 2016-02-26 DIAGNOSIS — Z6835 Body mass index (BMI) 35.0-35.9, adult: Secondary | ICD-10-CM | POA: Diagnosis not present

## 2016-02-26 DIAGNOSIS — F79 Unspecified intellectual disabilities: Secondary | ICD-10-CM | POA: Diagnosis not present

## 2016-02-26 DIAGNOSIS — R600 Localized edema: Secondary | ICD-10-CM | POA: Diagnosis not present

## 2016-03-02 DIAGNOSIS — M6281 Muscle weakness (generalized): Secondary | ICD-10-CM | POA: Diagnosis not present

## 2016-03-02 DIAGNOSIS — R600 Localized edema: Secondary | ICD-10-CM | POA: Diagnosis not present

## 2016-03-02 DIAGNOSIS — F79 Unspecified intellectual disabilities: Secondary | ICD-10-CM | POA: Diagnosis not present

## 2016-03-02 DIAGNOSIS — I10 Essential (primary) hypertension: Secondary | ICD-10-CM | POA: Diagnosis not present

## 2016-03-02 DIAGNOSIS — Z6835 Body mass index (BMI) 35.0-35.9, adult: Secondary | ICD-10-CM | POA: Diagnosis not present

## 2016-03-04 DIAGNOSIS — F79 Unspecified intellectual disabilities: Secondary | ICD-10-CM | POA: Diagnosis not present

## 2016-03-04 DIAGNOSIS — R600 Localized edema: Secondary | ICD-10-CM | POA: Diagnosis not present

## 2016-03-04 DIAGNOSIS — I10 Essential (primary) hypertension: Secondary | ICD-10-CM | POA: Diagnosis not present

## 2016-03-04 DIAGNOSIS — M6281 Muscle weakness (generalized): Secondary | ICD-10-CM | POA: Diagnosis not present

## 2016-03-04 DIAGNOSIS — Z6835 Body mass index (BMI) 35.0-35.9, adult: Secondary | ICD-10-CM | POA: Diagnosis not present

## 2016-03-09 DIAGNOSIS — Z6835 Body mass index (BMI) 35.0-35.9, adult: Secondary | ICD-10-CM | POA: Diagnosis not present

## 2016-03-09 DIAGNOSIS — F79 Unspecified intellectual disabilities: Secondary | ICD-10-CM | POA: Diagnosis not present

## 2016-03-09 DIAGNOSIS — R600 Localized edema: Secondary | ICD-10-CM | POA: Diagnosis not present

## 2016-03-09 DIAGNOSIS — I10 Essential (primary) hypertension: Secondary | ICD-10-CM | POA: Diagnosis not present

## 2016-03-09 DIAGNOSIS — M6281 Muscle weakness (generalized): Secondary | ICD-10-CM | POA: Diagnosis not present

## 2016-03-10 DIAGNOSIS — Z23 Encounter for immunization: Secondary | ICD-10-CM | POA: Diagnosis not present

## 2016-03-11 DIAGNOSIS — F79 Unspecified intellectual disabilities: Secondary | ICD-10-CM | POA: Diagnosis not present

## 2016-03-11 DIAGNOSIS — Z6835 Body mass index (BMI) 35.0-35.9, adult: Secondary | ICD-10-CM | POA: Diagnosis not present

## 2016-03-11 DIAGNOSIS — I10 Essential (primary) hypertension: Secondary | ICD-10-CM | POA: Diagnosis not present

## 2016-03-11 DIAGNOSIS — R600 Localized edema: Secondary | ICD-10-CM | POA: Diagnosis not present

## 2016-03-11 DIAGNOSIS — M6281 Muscle weakness (generalized): Secondary | ICD-10-CM | POA: Diagnosis not present

## 2016-03-16 DIAGNOSIS — F79 Unspecified intellectual disabilities: Secondary | ICD-10-CM | POA: Diagnosis not present

## 2016-03-16 DIAGNOSIS — R600 Localized edema: Secondary | ICD-10-CM | POA: Diagnosis not present

## 2016-03-16 DIAGNOSIS — Z6835 Body mass index (BMI) 35.0-35.9, adult: Secondary | ICD-10-CM | POA: Diagnosis not present

## 2016-03-16 DIAGNOSIS — M6281 Muscle weakness (generalized): Secondary | ICD-10-CM | POA: Diagnosis not present

## 2016-03-16 DIAGNOSIS — I10 Essential (primary) hypertension: Secondary | ICD-10-CM | POA: Diagnosis not present

## 2016-03-18 DIAGNOSIS — F79 Unspecified intellectual disabilities: Secondary | ICD-10-CM | POA: Diagnosis not present

## 2016-03-18 DIAGNOSIS — R600 Localized edema: Secondary | ICD-10-CM | POA: Diagnosis not present

## 2016-03-18 DIAGNOSIS — Z6835 Body mass index (BMI) 35.0-35.9, adult: Secondary | ICD-10-CM | POA: Diagnosis not present

## 2016-03-18 DIAGNOSIS — M6281 Muscle weakness (generalized): Secondary | ICD-10-CM | POA: Diagnosis not present

## 2016-03-18 DIAGNOSIS — Z9181 History of falling: Secondary | ICD-10-CM | POA: Diagnosis not present

## 2016-03-18 DIAGNOSIS — I10 Essential (primary) hypertension: Secondary | ICD-10-CM | POA: Diagnosis not present

## 2016-03-23 DIAGNOSIS — I10 Essential (primary) hypertension: Secondary | ICD-10-CM | POA: Diagnosis not present

## 2016-03-23 DIAGNOSIS — Z9181 History of falling: Secondary | ICD-10-CM | POA: Diagnosis not present

## 2016-03-23 DIAGNOSIS — F79 Unspecified intellectual disabilities: Secondary | ICD-10-CM | POA: Diagnosis not present

## 2016-03-23 DIAGNOSIS — Z6835 Body mass index (BMI) 35.0-35.9, adult: Secondary | ICD-10-CM | POA: Diagnosis not present

## 2016-03-23 DIAGNOSIS — M6281 Muscle weakness (generalized): Secondary | ICD-10-CM | POA: Diagnosis not present

## 2016-03-24 DIAGNOSIS — F79 Unspecified intellectual disabilities: Secondary | ICD-10-CM | POA: Diagnosis not present

## 2016-03-25 DIAGNOSIS — I10 Essential (primary) hypertension: Secondary | ICD-10-CM | POA: Diagnosis not present

## 2016-03-25 DIAGNOSIS — F79 Unspecified intellectual disabilities: Secondary | ICD-10-CM | POA: Diagnosis not present

## 2016-03-25 DIAGNOSIS — Z6835 Body mass index (BMI) 35.0-35.9, adult: Secondary | ICD-10-CM | POA: Diagnosis not present

## 2016-03-25 DIAGNOSIS — M6281 Muscle weakness (generalized): Secondary | ICD-10-CM | POA: Diagnosis not present

## 2016-03-25 DIAGNOSIS — Z9181 History of falling: Secondary | ICD-10-CM | POA: Diagnosis not present

## 2016-03-26 DIAGNOSIS — F79 Unspecified intellectual disabilities: Secondary | ICD-10-CM | POA: Diagnosis not present

## 2016-03-27 DIAGNOSIS — F79 Unspecified intellectual disabilities: Secondary | ICD-10-CM | POA: Diagnosis not present

## 2016-03-28 DIAGNOSIS — F79 Unspecified intellectual disabilities: Secondary | ICD-10-CM | POA: Diagnosis not present

## 2016-03-29 DIAGNOSIS — F79 Unspecified intellectual disabilities: Secondary | ICD-10-CM | POA: Diagnosis not present

## 2016-03-30 DIAGNOSIS — Z6835 Body mass index (BMI) 35.0-35.9, adult: Secondary | ICD-10-CM | POA: Diagnosis not present

## 2016-03-30 DIAGNOSIS — F79 Unspecified intellectual disabilities: Secondary | ICD-10-CM | POA: Diagnosis not present

## 2016-03-30 DIAGNOSIS — Z9181 History of falling: Secondary | ICD-10-CM | POA: Diagnosis not present

## 2016-03-30 DIAGNOSIS — I10 Essential (primary) hypertension: Secondary | ICD-10-CM | POA: Diagnosis not present

## 2016-03-30 DIAGNOSIS — M6281 Muscle weakness (generalized): Secondary | ICD-10-CM | POA: Diagnosis not present

## 2016-03-31 DIAGNOSIS — F79 Unspecified intellectual disabilities: Secondary | ICD-10-CM | POA: Diagnosis not present

## 2016-04-01 DIAGNOSIS — Z9181 History of falling: Secondary | ICD-10-CM | POA: Diagnosis not present

## 2016-04-01 DIAGNOSIS — M6281 Muscle weakness (generalized): Secondary | ICD-10-CM | POA: Diagnosis not present

## 2016-04-01 DIAGNOSIS — Z6835 Body mass index (BMI) 35.0-35.9, adult: Secondary | ICD-10-CM | POA: Diagnosis not present

## 2016-04-01 DIAGNOSIS — F79 Unspecified intellectual disabilities: Secondary | ICD-10-CM | POA: Diagnosis not present

## 2016-04-01 DIAGNOSIS — I10 Essential (primary) hypertension: Secondary | ICD-10-CM | POA: Diagnosis not present

## 2016-04-02 DIAGNOSIS — F79 Unspecified intellectual disabilities: Secondary | ICD-10-CM | POA: Diagnosis not present

## 2016-04-03 DIAGNOSIS — F79 Unspecified intellectual disabilities: Secondary | ICD-10-CM | POA: Diagnosis not present

## 2016-04-04 DIAGNOSIS — F79 Unspecified intellectual disabilities: Secondary | ICD-10-CM | POA: Diagnosis not present

## 2016-04-05 DIAGNOSIS — F79 Unspecified intellectual disabilities: Secondary | ICD-10-CM | POA: Diagnosis not present

## 2016-04-06 DIAGNOSIS — F79 Unspecified intellectual disabilities: Secondary | ICD-10-CM | POA: Diagnosis not present

## 2016-04-06 DIAGNOSIS — I10 Essential (primary) hypertension: Secondary | ICD-10-CM | POA: Diagnosis not present

## 2016-04-06 DIAGNOSIS — Z9181 History of falling: Secondary | ICD-10-CM | POA: Diagnosis not present

## 2016-04-06 DIAGNOSIS — Z6835 Body mass index (BMI) 35.0-35.9, adult: Secondary | ICD-10-CM | POA: Diagnosis not present

## 2016-04-06 DIAGNOSIS — M6281 Muscle weakness (generalized): Secondary | ICD-10-CM | POA: Diagnosis not present

## 2016-04-08 DIAGNOSIS — I10 Essential (primary) hypertension: Secondary | ICD-10-CM | POA: Diagnosis not present

## 2016-04-08 DIAGNOSIS — F79 Unspecified intellectual disabilities: Secondary | ICD-10-CM | POA: Diagnosis not present

## 2016-04-08 DIAGNOSIS — M6281 Muscle weakness (generalized): Secondary | ICD-10-CM | POA: Diagnosis not present

## 2016-04-08 DIAGNOSIS — Z9181 History of falling: Secondary | ICD-10-CM | POA: Diagnosis not present

## 2016-04-08 DIAGNOSIS — Z6835 Body mass index (BMI) 35.0-35.9, adult: Secondary | ICD-10-CM | POA: Diagnosis not present

## 2016-04-13 DIAGNOSIS — F79 Unspecified intellectual disabilities: Secondary | ICD-10-CM | POA: Diagnosis not present

## 2016-04-13 DIAGNOSIS — I10 Essential (primary) hypertension: Secondary | ICD-10-CM | POA: Diagnosis not present

## 2016-04-13 DIAGNOSIS — Z6835 Body mass index (BMI) 35.0-35.9, adult: Secondary | ICD-10-CM | POA: Diagnosis not present

## 2016-04-13 DIAGNOSIS — M6281 Muscle weakness (generalized): Secondary | ICD-10-CM | POA: Diagnosis not present

## 2016-04-13 DIAGNOSIS — Z9181 History of falling: Secondary | ICD-10-CM | POA: Diagnosis not present

## 2016-04-14 DIAGNOSIS — Z6835 Body mass index (BMI) 35.0-35.9, adult: Secondary | ICD-10-CM | POA: Diagnosis not present

## 2016-04-14 DIAGNOSIS — M6281 Muscle weakness (generalized): Secondary | ICD-10-CM | POA: Diagnosis not present

## 2016-04-14 DIAGNOSIS — F79 Unspecified intellectual disabilities: Secondary | ICD-10-CM | POA: Diagnosis not present

## 2016-04-14 DIAGNOSIS — I10 Essential (primary) hypertension: Secondary | ICD-10-CM | POA: Diagnosis not present

## 2016-04-14 DIAGNOSIS — Z9181 History of falling: Secondary | ICD-10-CM | POA: Diagnosis not present

## 2016-04-16 ENCOUNTER — Ambulatory Visit (INDEPENDENT_AMBULATORY_CARE_PROVIDER_SITE_OTHER): Payer: Commercial Managed Care - HMO | Admitting: Family Medicine

## 2016-04-16 DIAGNOSIS — F79 Unspecified intellectual disabilities: Secondary | ICD-10-CM

## 2016-04-16 DIAGNOSIS — M6281 Muscle weakness (generalized): Secondary | ICD-10-CM

## 2016-04-16 DIAGNOSIS — I10 Essential (primary) hypertension: Secondary | ICD-10-CM

## 2016-05-05 ENCOUNTER — Telehealth: Payer: Self-pay | Admitting: Family Medicine

## 2016-05-05 MED ORDER — GUAIFENESIN ER 600 MG PO TB12: 600.0000 mg | ORAL_TABLET | Freq: Two times a day (BID) | ORAL | 0 refills | Status: AC | PRN

## 2016-05-05 NOTE — Telephone Encounter (Signed)
Patient is having nasal congestion and cough x 2 days, has not had any fever.  Most residents there have been sick with the same thing.  They would like an order sent over for Mucinex to be given to her prn.  Please advise.

## 2016-05-07 ENCOUNTER — Encounter: Payer: Self-pay | Admitting: Family Medicine

## 2016-05-07 ENCOUNTER — Ambulatory Visit (INDEPENDENT_AMBULATORY_CARE_PROVIDER_SITE_OTHER): Payer: Commercial Managed Care - HMO | Admitting: Family Medicine

## 2016-05-07 VITALS — BP 116/65 | HR 97 | Temp 97.2°F | Ht 62.0 in | Wt 216.0 lb

## 2016-05-07 DIAGNOSIS — J209 Acute bronchitis, unspecified: Secondary | ICD-10-CM | POA: Diagnosis not present

## 2016-05-07 MED ORDER — DOXYCYCLINE MONOHYDRATE 100 MG PO TABS: 100.0000 mg | ORAL_TABLET | Freq: Two times a day (BID) | ORAL | 0 refills | Status: AC

## 2016-05-07 MED ORDER — ALBUTEROL SULFATE HFA 108 (90 BASE) MCG/ACT IN AERS: 2.0000 | INHALATION_SPRAY | Freq: Four times a day (QID) | RESPIRATORY_TRACT | 0 refills | Status: AC | PRN

## 2016-05-07 MED ORDER — POCKET SPACER DEVI: 1.0000 | 0 refills | Status: AC | PRN

## 2016-05-07 NOTE — Progress Notes (Signed)
BP 116/65   Pulse 97   Temp 97.2 F (36.2 C) (Oral)   Ht 5\' 2"  (1.575 m)   Wt 216 lb (98 kg)   SpO2 94%   BMI 39.51 kg/m    Subjective:    Patient ID: [name], female    DOB: Mar 14, 1946, 70 y.o.   MRN: [id number]  HPI: ANGELYNN HARROP is a 70 y.o. female presenting on 05/07/2016 for Cough, chest congestion, wheezing (x 4 days, got much worse yesterday; started taking Mucinex & Robitussin yesterday)   HPI Cough and chest congestion and wheezing Patient comes in with cough and chest congestion and wheezing this been going on 4 days. She is brought in today by her daughter as the patient herself is very mentally handicapped. Her cough and chest congestion that has been worsening and she has developed wheezing over the past day. They have given her Mucinex and Tylenol which has helped some but she is still having a lot of issues with the cough and wheezing. She also gets short of breath walking short distances since she's been ill over the past 4 days and she usually was able to walk this distances without getting short of breath. She denies any fevers or chills. Her cough is been productive of yellow-green sputum. She is also using some pus and cough medicine.  Relevant past medical, surgical, family and social history reviewed and updated as indicated. Interim medical history since our last visit reviewed. Allergies and medications reviewed and updated.  Review of Systems  Constitutional: Negative for chills and fever.  HENT: Positive for congestion, postnasal drip, rhinorrhea, sinus pressure, sneezing and sore throat. Negative for ear discharge and ear pain.   Eyes: Negative for pain, redness and visual disturbance.  Respiratory: Positive for cough and wheezing. Negative for chest tightness and shortness of breath.   Cardiovascular: Negative for chest pain and leg swelling.  Genitourinary: Negative for difficulty urinating and dysuria.  Musculoskeletal: Negative for  back pain and gait problem.  Skin: Negative for rash.  Neurological: Negative for light-headedness and headaches.  Psychiatric/Behavioral: Negative for agitation and behavioral problems.  All other systems reviewed and are negative.   Per HPI unless specifically indicated above     Objective:    BP 116/65   Pulse 97   Temp 97.2 F (36.2 C) (Oral)   Ht 5\' 2"  (1.575 m)   Wt 216 lb (98 kg)   SpO2 94%   BMI 39.51 kg/m   Wt Readings from Last 3 Encounters:  05/07/16 216 lb (98 kg)  11/13/15 200 lb (90.7 kg)  10/03/15 197 lb 9.6 oz (89.6 kg)    Physical Exam  Constitutional: She is oriented to person, place, and time. She appears well-developed and well-nourished. No distress.  HENT:  Right Ear: Tympanic membrane, external ear and ear canal normal.  Left Ear: Tympanic membrane, external ear and ear canal normal.  Nose: Mucosal edema and rhinorrhea present. No epistaxis. Right sinus exhibits no maxillary sinus tenderness and no frontal sinus tenderness. Left sinus exhibits no maxillary sinus tenderness and no frontal sinus tenderness.  Mouth/Throat: Uvula is midline and mucous membranes are normal. Posterior oropharyngeal edema and posterior oropharyngeal erythema present. No oropharyngeal exudate or tonsillar abscesses.  Eyes: Conjunctivae are normal.  Neck: Neck supple. No thyromegaly present.  Cardiovascular: Normal rate, regular rhythm, normal heart sounds and intact distal pulses.   No murmur heard. Pulmonary/Chest: Effort normal. No respiratory distress. She has wheezes. She has no rales.  Musculoskeletal: Normal range of motion. She exhibits no edema or tenderness.  Lymphadenopathy:    She has no cervical adenopathy.  Neurological: She is alert and oriented to person, place, and time. Coordination normal.  Skin: Skin is warm and dry. No rash noted. She is not diaphoretic.  Psychiatric: She has a normal mood and affect. Her behavior is normal.  Vitals reviewed.       Assessment & Plan:   Problem List Items Addressed This Visit    None    Visit Diagnoses    Acute bronchitis, unspecified organism    -  Primary   Relevant Medications   doxycycline (ADOXA) 100 MG tablet   albuterol (PROVENTIL HFA;VENTOLIN HFA) 108 (90 Base) MCG/ACT inhaler   Spacer/Aero-Holding Chambers (POCKET SPACER) DEVI       Follow up plan: Return if symptoms worsen or fail to improve.  Counseling provided for all of the vaccine components No orders of the defined types were placed in this encounter.   Caryl Pina, MD [facility] 05/07/2016, 2:07 PM

## 2016-05-12 DIAGNOSIS — F79 Unspecified intellectual disabilities: Secondary | ICD-10-CM | POA: Diagnosis not present

## 2016-05-13 DIAGNOSIS — F79 Unspecified intellectual disabilities: Secondary | ICD-10-CM | POA: Diagnosis not present

## 2016-05-14 DIAGNOSIS — F79 Unspecified intellectual disabilities: Secondary | ICD-10-CM | POA: Diagnosis not present

## 2016-05-15 DIAGNOSIS — F79 Unspecified intellectual disabilities: Secondary | ICD-10-CM | POA: Diagnosis not present

## 2016-05-16 DIAGNOSIS — F79 Unspecified intellectual disabilities: Secondary | ICD-10-CM | POA: Diagnosis not present

## 2016-05-17 DIAGNOSIS — F79 Unspecified intellectual disabilities: Secondary | ICD-10-CM | POA: Diagnosis not present

## 2016-05-18 DIAGNOSIS — F79 Unspecified intellectual disabilities: Secondary | ICD-10-CM | POA: Diagnosis not present

## 2016-05-19 DIAGNOSIS — F79 Unspecified intellectual disabilities: Secondary | ICD-10-CM | POA: Diagnosis not present

## 2016-05-20 DIAGNOSIS — F79 Unspecified intellectual disabilities: Secondary | ICD-10-CM | POA: Diagnosis not present

## 2016-05-21 DIAGNOSIS — F79 Unspecified intellectual disabilities: Secondary | ICD-10-CM | POA: Diagnosis not present

## 2016-05-22 DIAGNOSIS — F79 Unspecified intellectual disabilities: Secondary | ICD-10-CM | POA: Diagnosis not present

## 2016-05-23 DIAGNOSIS — F79 Unspecified intellectual disabilities: Secondary | ICD-10-CM | POA: Diagnosis not present

## 2016-05-24 DIAGNOSIS — F79 Unspecified intellectual disabilities: Secondary | ICD-10-CM | POA: Diagnosis not present

## 2016-05-25 DIAGNOSIS — F79 Unspecified intellectual disabilities: Secondary | ICD-10-CM | POA: Diagnosis not present

## 2016-05-26 DIAGNOSIS — F79 Unspecified intellectual disabilities: Secondary | ICD-10-CM | POA: Diagnosis not present

## 2016-05-27 DIAGNOSIS — F79 Unspecified intellectual disabilities: Secondary | ICD-10-CM | POA: Diagnosis not present

## 2016-05-28 DIAGNOSIS — F79 Unspecified intellectual disabilities: Secondary | ICD-10-CM | POA: Diagnosis not present

## 2016-05-29 DIAGNOSIS — F79 Unspecified intellectual disabilities: Secondary | ICD-10-CM | POA: Diagnosis not present

## 2016-05-30 DIAGNOSIS — F79 Unspecified intellectual disabilities: Secondary | ICD-10-CM | POA: Diagnosis not present

## 2016-05-31 DIAGNOSIS — F79 Unspecified intellectual disabilities: Secondary | ICD-10-CM | POA: Diagnosis not present

## 2016-06-01 DIAGNOSIS — F79 Unspecified intellectual disabilities: Secondary | ICD-10-CM | POA: Diagnosis not present

## 2016-06-02 DIAGNOSIS — F79 Unspecified intellectual disabilities: Secondary | ICD-10-CM | POA: Diagnosis not present

## 2016-06-03 DIAGNOSIS — F79 Unspecified intellectual disabilities: Secondary | ICD-10-CM | POA: Diagnosis not present

## 2016-06-04 DIAGNOSIS — F79 Unspecified intellectual disabilities: Secondary | ICD-10-CM | POA: Diagnosis not present

## 2016-06-05 DIAGNOSIS — F79 Unspecified intellectual disabilities: Secondary | ICD-10-CM | POA: Diagnosis not present

## 2016-06-06 DIAGNOSIS — F79 Unspecified intellectual disabilities: Secondary | ICD-10-CM | POA: Diagnosis not present

## 2016-06-07 DIAGNOSIS — F79 Unspecified intellectual disabilities: Secondary | ICD-10-CM | POA: Diagnosis not present

## 2016-06-08 DIAGNOSIS — F79 Unspecified intellectual disabilities: Secondary | ICD-10-CM | POA: Diagnosis not present

## 2016-06-09 DIAGNOSIS — F79 Unspecified intellectual disabilities: Secondary | ICD-10-CM | POA: Diagnosis not present

## 2016-06-10 DIAGNOSIS — F79 Unspecified intellectual disabilities: Secondary | ICD-10-CM | POA: Diagnosis not present

## 2016-06-11 DIAGNOSIS — F79 Unspecified intellectual disabilities: Secondary | ICD-10-CM | POA: Diagnosis not present

## 2016-06-12 DIAGNOSIS — F79 Unspecified intellectual disabilities: Secondary | ICD-10-CM | POA: Diagnosis not present

## 2016-06-13 DIAGNOSIS — F79 Unspecified intellectual disabilities: Secondary | ICD-10-CM | POA: Diagnosis not present

## 2016-06-14 DIAGNOSIS — F79 Unspecified intellectual disabilities: Secondary | ICD-10-CM | POA: Diagnosis not present

## 2016-06-15 DIAGNOSIS — F79 Unspecified intellectual disabilities: Secondary | ICD-10-CM | POA: Diagnosis not present

## 2016-06-16 DIAGNOSIS — F79 Unspecified intellectual disabilities: Secondary | ICD-10-CM | POA: Diagnosis not present

## 2016-06-17 DIAGNOSIS — F79 Unspecified intellectual disabilities: Secondary | ICD-10-CM | POA: Diagnosis not present

## 2016-06-18 DIAGNOSIS — F79 Unspecified intellectual disabilities: Secondary | ICD-10-CM | POA: Diagnosis not present

## 2016-06-19 DIAGNOSIS — F79 Unspecified intellectual disabilities: Secondary | ICD-10-CM | POA: Diagnosis not present

## 2016-06-20 DIAGNOSIS — F79 Unspecified intellectual disabilities: Secondary | ICD-10-CM | POA: Diagnosis not present

## 2016-06-21 DIAGNOSIS — F79 Unspecified intellectual disabilities: Secondary | ICD-10-CM | POA: Diagnosis not present

## 2016-06-22 DIAGNOSIS — F79 Unspecified intellectual disabilities: Secondary | ICD-10-CM | POA: Diagnosis not present

## 2016-06-23 DIAGNOSIS — F79 Unspecified intellectual disabilities: Secondary | ICD-10-CM | POA: Diagnosis not present

## 2016-06-24 DIAGNOSIS — F79 Unspecified intellectual disabilities: Secondary | ICD-10-CM | POA: Diagnosis not present

## 2016-06-25 DIAGNOSIS — F79 Unspecified intellectual disabilities: Secondary | ICD-10-CM | POA: Diagnosis not present

## 2016-06-26 DIAGNOSIS — F79 Unspecified intellectual disabilities: Secondary | ICD-10-CM | POA: Diagnosis not present

## 2016-06-27 DIAGNOSIS — F79 Unspecified intellectual disabilities: Secondary | ICD-10-CM | POA: Diagnosis not present

## 2016-06-28 DIAGNOSIS — F79 Unspecified intellectual disabilities: Secondary | ICD-10-CM | POA: Diagnosis not present

## 2016-06-29 DIAGNOSIS — F79 Unspecified intellectual disabilities: Secondary | ICD-10-CM | POA: Diagnosis not present

## 2016-06-30 DIAGNOSIS — F79 Unspecified intellectual disabilities: Secondary | ICD-10-CM | POA: Diagnosis not present

## 2016-07-01 DIAGNOSIS — F79 Unspecified intellectual disabilities: Secondary | ICD-10-CM | POA: Diagnosis not present

## 2016-07-02 DIAGNOSIS — F79 Unspecified intellectual disabilities: Secondary | ICD-10-CM | POA: Diagnosis not present

## 2016-07-03 DIAGNOSIS — F79 Unspecified intellectual disabilities: Secondary | ICD-10-CM | POA: Diagnosis not present

## 2016-07-04 DIAGNOSIS — F79 Unspecified intellectual disabilities: Secondary | ICD-10-CM | POA: Diagnosis not present

## 2016-07-05 DIAGNOSIS — F79 Unspecified intellectual disabilities: Secondary | ICD-10-CM | POA: Diagnosis not present

## 2016-07-06 DIAGNOSIS — F79 Unspecified intellectual disabilities: Secondary | ICD-10-CM | POA: Diagnosis not present

## 2016-07-07 DIAGNOSIS — F79 Unspecified intellectual disabilities: Secondary | ICD-10-CM | POA: Diagnosis not present

## 2016-07-08 DIAGNOSIS — F79 Unspecified intellectual disabilities: Secondary | ICD-10-CM | POA: Diagnosis not present

## 2016-07-09 DIAGNOSIS — F79 Unspecified intellectual disabilities: Secondary | ICD-10-CM | POA: Diagnosis not present

## 2016-07-10 DIAGNOSIS — F79 Unspecified intellectual disabilities: Secondary | ICD-10-CM | POA: Diagnosis not present

## 2016-07-11 DIAGNOSIS — F79 Unspecified intellectual disabilities: Secondary | ICD-10-CM | POA: Diagnosis not present

## 2016-07-12 DIAGNOSIS — F79 Unspecified intellectual disabilities: Secondary | ICD-10-CM | POA: Diagnosis not present

## 2016-07-13 DIAGNOSIS — F79 Unspecified intellectual disabilities: Secondary | ICD-10-CM | POA: Diagnosis not present

## 2016-07-14 DIAGNOSIS — F79 Unspecified intellectual disabilities: Secondary | ICD-10-CM | POA: Diagnosis not present

## 2016-07-15 DIAGNOSIS — F79 Unspecified intellectual disabilities: Secondary | ICD-10-CM | POA: Diagnosis not present

## 2016-07-16 DIAGNOSIS — F79 Unspecified intellectual disabilities: Secondary | ICD-10-CM | POA: Diagnosis not present

## 2016-07-17 DIAGNOSIS — F79 Unspecified intellectual disabilities: Secondary | ICD-10-CM | POA: Diagnosis not present

## 2016-07-18 DIAGNOSIS — F79 Unspecified intellectual disabilities: Secondary | ICD-10-CM | POA: Diagnosis not present

## 2016-07-19 DIAGNOSIS — F79 Unspecified intellectual disabilities: Secondary | ICD-10-CM | POA: Diagnosis not present

## 2016-07-20 DIAGNOSIS — F79 Unspecified intellectual disabilities: Secondary | ICD-10-CM | POA: Diagnosis not present

## 2016-07-21 DIAGNOSIS — F79 Unspecified intellectual disabilities: Secondary | ICD-10-CM | POA: Diagnosis not present

## 2016-07-22 DIAGNOSIS — F79 Unspecified intellectual disabilities: Secondary | ICD-10-CM | POA: Diagnosis not present

## 2016-07-23 DIAGNOSIS — F79 Unspecified intellectual disabilities: Secondary | ICD-10-CM | POA: Diagnosis not present

## 2016-07-24 DIAGNOSIS — F79 Unspecified intellectual disabilities: Secondary | ICD-10-CM | POA: Diagnosis not present

## 2016-07-25 DIAGNOSIS — F79 Unspecified intellectual disabilities: Secondary | ICD-10-CM | POA: Diagnosis not present

## 2016-07-26 DIAGNOSIS — F79 Unspecified intellectual disabilities: Secondary | ICD-10-CM | POA: Diagnosis not present

## 2016-07-27 DIAGNOSIS — F79 Unspecified intellectual disabilities: Secondary | ICD-10-CM | POA: Diagnosis not present

## 2016-07-28 DIAGNOSIS — F79 Unspecified intellectual disabilities: Secondary | ICD-10-CM | POA: Diagnosis not present

## 2016-07-29 DIAGNOSIS — F79 Unspecified intellectual disabilities: Secondary | ICD-10-CM | POA: Diagnosis not present

## 2016-07-30 DIAGNOSIS — F79 Unspecified intellectual disabilities: Secondary | ICD-10-CM | POA: Diagnosis not present

## 2016-07-31 DIAGNOSIS — F79 Unspecified intellectual disabilities: Secondary | ICD-10-CM | POA: Diagnosis not present

## 2016-08-01 DIAGNOSIS — F79 Unspecified intellectual disabilities: Secondary | ICD-10-CM | POA: Diagnosis not present

## 2016-08-02 DIAGNOSIS — F79 Unspecified intellectual disabilities: Secondary | ICD-10-CM | POA: Diagnosis not present

## 2016-08-03 DIAGNOSIS — F79 Unspecified intellectual disabilities: Secondary | ICD-10-CM | POA: Diagnosis not present

## 2016-08-04 DIAGNOSIS — F79 Unspecified intellectual disabilities: Secondary | ICD-10-CM | POA: Diagnosis not present

## 2016-08-05 DIAGNOSIS — F79 Unspecified intellectual disabilities: Secondary | ICD-10-CM | POA: Diagnosis not present

## 2016-08-06 DIAGNOSIS — F79 Unspecified intellectual disabilities: Secondary | ICD-10-CM | POA: Diagnosis not present

## 2016-08-07 DIAGNOSIS — F79 Unspecified intellectual disabilities: Secondary | ICD-10-CM | POA: Diagnosis not present

## 2016-08-08 DIAGNOSIS — F79 Unspecified intellectual disabilities: Secondary | ICD-10-CM | POA: Diagnosis not present

## 2016-08-09 DIAGNOSIS — F79 Unspecified intellectual disabilities: Secondary | ICD-10-CM | POA: Diagnosis not present

## 2016-08-10 DIAGNOSIS — F79 Unspecified intellectual disabilities: Secondary | ICD-10-CM | POA: Diagnosis not present

## 2016-08-10 DIAGNOSIS — R32 Unspecified urinary incontinence: Secondary | ICD-10-CM | POA: Diagnosis not present

## 2016-08-11 DIAGNOSIS — F79 Unspecified intellectual disabilities: Secondary | ICD-10-CM | POA: Diagnosis not present

## 2016-08-12 DIAGNOSIS — F79 Unspecified intellectual disabilities: Secondary | ICD-10-CM | POA: Diagnosis not present

## 2016-08-13 DIAGNOSIS — F79 Unspecified intellectual disabilities: Secondary | ICD-10-CM | POA: Diagnosis not present

## 2016-08-14 DIAGNOSIS — F79 Unspecified intellectual disabilities: Secondary | ICD-10-CM | POA: Diagnosis not present

## 2016-08-15 DIAGNOSIS — F79 Unspecified intellectual disabilities: Secondary | ICD-10-CM | POA: Diagnosis not present

## 2016-08-16 DIAGNOSIS — F79 Unspecified intellectual disabilities: Secondary | ICD-10-CM | POA: Diagnosis not present

## 2016-08-17 DIAGNOSIS — F79 Unspecified intellectual disabilities: Secondary | ICD-10-CM | POA: Diagnosis not present

## 2016-08-18 DIAGNOSIS — F79 Unspecified intellectual disabilities: Secondary | ICD-10-CM | POA: Diagnosis not present

## 2016-08-19 DIAGNOSIS — F79 Unspecified intellectual disabilities: Secondary | ICD-10-CM | POA: Diagnosis not present

## 2016-08-20 DIAGNOSIS — F79 Unspecified intellectual disabilities: Secondary | ICD-10-CM | POA: Diagnosis not present

## 2016-08-21 DIAGNOSIS — F79 Unspecified intellectual disabilities: Secondary | ICD-10-CM | POA: Diagnosis not present

## 2016-08-22 DIAGNOSIS — F79 Unspecified intellectual disabilities: Secondary | ICD-10-CM | POA: Diagnosis not present

## 2016-08-23 DIAGNOSIS — F79 Unspecified intellectual disabilities: Secondary | ICD-10-CM | POA: Diagnosis not present

## 2016-08-24 DIAGNOSIS — F79 Unspecified intellectual disabilities: Secondary | ICD-10-CM | POA: Diagnosis not present

## 2016-08-25 DIAGNOSIS — F79 Unspecified intellectual disabilities: Secondary | ICD-10-CM | POA: Diagnosis not present

## 2016-08-26 DIAGNOSIS — F79 Unspecified intellectual disabilities: Secondary | ICD-10-CM | POA: Diagnosis not present

## 2016-08-27 DIAGNOSIS — F79 Unspecified intellectual disabilities: Secondary | ICD-10-CM | POA: Diagnosis not present

## 2016-08-28 DIAGNOSIS — F79 Unspecified intellectual disabilities: Secondary | ICD-10-CM | POA: Diagnosis not present

## 2016-08-29 DIAGNOSIS — F79 Unspecified intellectual disabilities: Secondary | ICD-10-CM | POA: Diagnosis not present

## 2016-08-30 DIAGNOSIS — F79 Unspecified intellectual disabilities: Secondary | ICD-10-CM | POA: Diagnosis not present

## 2016-08-31 DIAGNOSIS — F79 Unspecified intellectual disabilities: Secondary | ICD-10-CM | POA: Diagnosis not present

## 2016-09-01 DIAGNOSIS — F79 Unspecified intellectual disabilities: Secondary | ICD-10-CM | POA: Diagnosis not present

## 2016-09-02 DIAGNOSIS — F79 Unspecified intellectual disabilities: Secondary | ICD-10-CM | POA: Diagnosis not present

## 2016-09-03 DIAGNOSIS — F79 Unspecified intellectual disabilities: Secondary | ICD-10-CM | POA: Diagnosis not present

## 2016-09-04 DIAGNOSIS — F79 Unspecified intellectual disabilities: Secondary | ICD-10-CM | POA: Diagnosis not present

## 2016-09-05 DIAGNOSIS — F79 Unspecified intellectual disabilities: Secondary | ICD-10-CM | POA: Diagnosis not present

## 2016-09-06 DIAGNOSIS — F79 Unspecified intellectual disabilities: Secondary | ICD-10-CM | POA: Diagnosis not present

## 2016-09-07 DIAGNOSIS — F79 Unspecified intellectual disabilities: Secondary | ICD-10-CM | POA: Diagnosis not present

## 2016-09-08 DIAGNOSIS — F79 Unspecified intellectual disabilities: Secondary | ICD-10-CM | POA: Diagnosis not present

## 2016-09-08 DIAGNOSIS — R32 Unspecified urinary incontinence: Secondary | ICD-10-CM | POA: Diagnosis not present

## 2016-09-09 DIAGNOSIS — F79 Unspecified intellectual disabilities: Secondary | ICD-10-CM | POA: Diagnosis not present

## 2016-09-10 DIAGNOSIS — F79 Unspecified intellectual disabilities: Secondary | ICD-10-CM | POA: Diagnosis not present

## 2016-09-11 DIAGNOSIS — F79 Unspecified intellectual disabilities: Secondary | ICD-10-CM | POA: Diagnosis not present

## 2016-09-12 DIAGNOSIS — F79 Unspecified intellectual disabilities: Secondary | ICD-10-CM | POA: Diagnosis not present

## 2016-09-13 DIAGNOSIS — F79 Unspecified intellectual disabilities: Secondary | ICD-10-CM | POA: Diagnosis not present

## 2016-09-14 DIAGNOSIS — F79 Unspecified intellectual disabilities: Secondary | ICD-10-CM | POA: Diagnosis not present

## 2016-09-15 DIAGNOSIS — F79 Unspecified intellectual disabilities: Secondary | ICD-10-CM | POA: Diagnosis not present

## 2016-09-16 DIAGNOSIS — F79 Unspecified intellectual disabilities: Secondary | ICD-10-CM | POA: Diagnosis not present

## 2016-09-17 DIAGNOSIS — F79 Unspecified intellectual disabilities: Secondary | ICD-10-CM | POA: Diagnosis not present

## 2016-09-18 DIAGNOSIS — F79 Unspecified intellectual disabilities: Secondary | ICD-10-CM | POA: Diagnosis not present

## 2016-09-19 DIAGNOSIS — F79 Unspecified intellectual disabilities: Secondary | ICD-10-CM | POA: Diagnosis not present

## 2016-09-20 DIAGNOSIS — F79 Unspecified intellectual disabilities: Secondary | ICD-10-CM | POA: Diagnosis not present

## 2016-09-21 DIAGNOSIS — F79 Unspecified intellectual disabilities: Secondary | ICD-10-CM | POA: Diagnosis not present

## 2016-09-22 DIAGNOSIS — F79 Unspecified intellectual disabilities: Secondary | ICD-10-CM | POA: Diagnosis not present

## 2016-09-23 DIAGNOSIS — F79 Unspecified intellectual disabilities: Secondary | ICD-10-CM | POA: Diagnosis not present

## 2016-09-24 DIAGNOSIS — F79 Unspecified intellectual disabilities: Secondary | ICD-10-CM | POA: Diagnosis not present

## 2016-09-25 DIAGNOSIS — F79 Unspecified intellectual disabilities: Secondary | ICD-10-CM | POA: Diagnosis not present

## 2016-09-26 DIAGNOSIS — F79 Unspecified intellectual disabilities: Secondary | ICD-10-CM | POA: Diagnosis not present

## 2016-09-27 DIAGNOSIS — F79 Unspecified intellectual disabilities: Secondary | ICD-10-CM | POA: Diagnosis not present

## 2016-09-28 DIAGNOSIS — F79 Unspecified intellectual disabilities: Secondary | ICD-10-CM | POA: Diagnosis not present

## 2016-09-29 DIAGNOSIS — F79 Unspecified intellectual disabilities: Secondary | ICD-10-CM | POA: Diagnosis not present

## 2016-09-30 DIAGNOSIS — F79 Unspecified intellectual disabilities: Secondary | ICD-10-CM | POA: Diagnosis not present

## 2016-10-01 DIAGNOSIS — F79 Unspecified intellectual disabilities: Secondary | ICD-10-CM | POA: Diagnosis not present

## 2016-10-02 DIAGNOSIS — F79 Unspecified intellectual disabilities: Secondary | ICD-10-CM | POA: Diagnosis not present

## 2016-10-03 DIAGNOSIS — F79 Unspecified intellectual disabilities: Secondary | ICD-10-CM | POA: Diagnosis not present

## 2016-10-04 DIAGNOSIS — F79 Unspecified intellectual disabilities: Secondary | ICD-10-CM | POA: Diagnosis not present

## 2016-10-05 DIAGNOSIS — F79 Unspecified intellectual disabilities: Secondary | ICD-10-CM | POA: Diagnosis not present

## 2016-10-06 DIAGNOSIS — F79 Unspecified intellectual disabilities: Secondary | ICD-10-CM | POA: Diagnosis not present

## 2016-10-07 DIAGNOSIS — F79 Unspecified intellectual disabilities: Secondary | ICD-10-CM | POA: Diagnosis not present

## 2016-10-08 DIAGNOSIS — F79 Unspecified intellectual disabilities: Secondary | ICD-10-CM | POA: Diagnosis not present

## 2016-10-09 DIAGNOSIS — F79 Unspecified intellectual disabilities: Secondary | ICD-10-CM | POA: Diagnosis not present

## 2016-10-09 DIAGNOSIS — R32 Unspecified urinary incontinence: Secondary | ICD-10-CM | POA: Diagnosis not present

## 2016-10-10 DIAGNOSIS — F79 Unspecified intellectual disabilities: Secondary | ICD-10-CM | POA: Diagnosis not present

## 2016-10-11 DIAGNOSIS — F79 Unspecified intellectual disabilities: Secondary | ICD-10-CM | POA: Diagnosis not present

## 2016-10-12 DIAGNOSIS — F79 Unspecified intellectual disabilities: Secondary | ICD-10-CM | POA: Diagnosis not present

## 2016-10-13 DIAGNOSIS — F79 Unspecified intellectual disabilities: Secondary | ICD-10-CM | POA: Diagnosis not present

## 2016-10-14 DIAGNOSIS — F79 Unspecified intellectual disabilities: Secondary | ICD-10-CM | POA: Diagnosis not present

## 2016-10-15 DIAGNOSIS — F79 Unspecified intellectual disabilities: Secondary | ICD-10-CM | POA: Diagnosis not present

## 2016-10-16 DIAGNOSIS — F79 Unspecified intellectual disabilities: Secondary | ICD-10-CM | POA: Diagnosis not present

## 2016-10-17 DIAGNOSIS — F79 Unspecified intellectual disabilities: Secondary | ICD-10-CM | POA: Diagnosis not present

## 2016-10-18 DIAGNOSIS — F79 Unspecified intellectual disabilities: Secondary | ICD-10-CM | POA: Diagnosis not present

## 2016-10-19 DIAGNOSIS — F79 Unspecified intellectual disabilities: Secondary | ICD-10-CM | POA: Diagnosis not present

## 2016-10-20 DIAGNOSIS — F79 Unspecified intellectual disabilities: Secondary | ICD-10-CM | POA: Diagnosis not present

## 2016-10-21 DIAGNOSIS — F79 Unspecified intellectual disabilities: Secondary | ICD-10-CM | POA: Diagnosis not present

## 2016-10-22 DIAGNOSIS — F79 Unspecified intellectual disabilities: Secondary | ICD-10-CM | POA: Diagnosis not present

## 2016-10-23 DIAGNOSIS — F79 Unspecified intellectual disabilities: Secondary | ICD-10-CM | POA: Diagnosis not present

## 2016-10-24 DIAGNOSIS — F79 Unspecified intellectual disabilities: Secondary | ICD-10-CM | POA: Diagnosis not present

## 2016-10-25 DIAGNOSIS — F79 Unspecified intellectual disabilities: Secondary | ICD-10-CM | POA: Diagnosis not present

## 2016-10-26 DIAGNOSIS — F79 Unspecified intellectual disabilities: Secondary | ICD-10-CM | POA: Diagnosis not present

## 2016-10-27 DIAGNOSIS — F79 Unspecified intellectual disabilities: Secondary | ICD-10-CM | POA: Diagnosis not present

## 2016-10-28 DIAGNOSIS — F79 Unspecified intellectual disabilities: Secondary | ICD-10-CM | POA: Diagnosis not present

## 2016-10-29 DIAGNOSIS — F79 Unspecified intellectual disabilities: Secondary | ICD-10-CM | POA: Diagnosis not present

## 2016-10-30 DIAGNOSIS — F79 Unspecified intellectual disabilities: Secondary | ICD-10-CM | POA: Diagnosis not present

## 2016-10-31 DIAGNOSIS — F79 Unspecified intellectual disabilities: Secondary | ICD-10-CM | POA: Diagnosis not present

## 2016-11-01 DIAGNOSIS — F79 Unspecified intellectual disabilities: Secondary | ICD-10-CM | POA: Diagnosis not present

## 2016-11-02 DIAGNOSIS — F79 Unspecified intellectual disabilities: Secondary | ICD-10-CM | POA: Diagnosis not present

## 2016-11-03 DIAGNOSIS — F79 Unspecified intellectual disabilities: Secondary | ICD-10-CM | POA: Diagnosis not present

## 2016-11-04 DIAGNOSIS — F79 Unspecified intellectual disabilities: Secondary | ICD-10-CM | POA: Diagnosis not present

## 2016-11-05 DIAGNOSIS — F79 Unspecified intellectual disabilities: Secondary | ICD-10-CM | POA: Diagnosis not present

## 2016-11-06 DIAGNOSIS — F79 Unspecified intellectual disabilities: Secondary | ICD-10-CM | POA: Diagnosis not present

## 2016-11-07 DIAGNOSIS — F79 Unspecified intellectual disabilities: Secondary | ICD-10-CM | POA: Diagnosis not present

## 2016-11-08 DIAGNOSIS — F79 Unspecified intellectual disabilities: Secondary | ICD-10-CM | POA: Diagnosis not present

## 2016-11-09 DIAGNOSIS — F79 Unspecified intellectual disabilities: Secondary | ICD-10-CM | POA: Diagnosis not present

## 2016-11-10 DIAGNOSIS — F79 Unspecified intellectual disabilities: Secondary | ICD-10-CM | POA: Diagnosis not present

## 2016-11-10 DIAGNOSIS — R32 Unspecified urinary incontinence: Secondary | ICD-10-CM | POA: Diagnosis not present

## 2016-11-11 DIAGNOSIS — F79 Unspecified intellectual disabilities: Secondary | ICD-10-CM | POA: Diagnosis not present

## 2016-11-12 DIAGNOSIS — F79 Unspecified intellectual disabilities: Secondary | ICD-10-CM | POA: Diagnosis not present

## 2016-11-13 DIAGNOSIS — F79 Unspecified intellectual disabilities: Secondary | ICD-10-CM | POA: Diagnosis not present

## 2016-11-14 DIAGNOSIS — F79 Unspecified intellectual disabilities: Secondary | ICD-10-CM | POA: Diagnosis not present

## 2016-11-15 DIAGNOSIS — F79 Unspecified intellectual disabilities: Secondary | ICD-10-CM | POA: Diagnosis not present

## 2016-11-16 DIAGNOSIS — F79 Unspecified intellectual disabilities: Secondary | ICD-10-CM | POA: Diagnosis not present

## 2016-11-17 DIAGNOSIS — F79 Unspecified intellectual disabilities: Secondary | ICD-10-CM | POA: Diagnosis not present

## 2016-11-18 DIAGNOSIS — F79 Unspecified intellectual disabilities: Secondary | ICD-10-CM | POA: Diagnosis not present

## 2016-11-19 DIAGNOSIS — F79 Unspecified intellectual disabilities: Secondary | ICD-10-CM | POA: Diagnosis not present

## 2016-11-20 DIAGNOSIS — F79 Unspecified intellectual disabilities: Secondary | ICD-10-CM | POA: Diagnosis not present

## 2016-11-21 DIAGNOSIS — F79 Unspecified intellectual disabilities: Secondary | ICD-10-CM | POA: Diagnosis not present

## 2016-11-22 DIAGNOSIS — F79 Unspecified intellectual disabilities: Secondary | ICD-10-CM | POA: Diagnosis not present

## 2016-11-23 DIAGNOSIS — F79 Unspecified intellectual disabilities: Secondary | ICD-10-CM | POA: Diagnosis not present

## 2016-11-24 DIAGNOSIS — F79 Unspecified intellectual disabilities: Secondary | ICD-10-CM | POA: Diagnosis not present

## 2016-11-25 DIAGNOSIS — F79 Unspecified intellectual disabilities: Secondary | ICD-10-CM | POA: Diagnosis not present

## 2016-11-26 DIAGNOSIS — F79 Unspecified intellectual disabilities: Secondary | ICD-10-CM | POA: Diagnosis not present

## 2016-11-27 DIAGNOSIS — F79 Unspecified intellectual disabilities: Secondary | ICD-10-CM | POA: Diagnosis not present

## 2016-11-28 DIAGNOSIS — F79 Unspecified intellectual disabilities: Secondary | ICD-10-CM | POA: Diagnosis not present

## 2016-11-29 DIAGNOSIS — F79 Unspecified intellectual disabilities: Secondary | ICD-10-CM | POA: Diagnosis not present

## 2016-11-30 DIAGNOSIS — F79 Unspecified intellectual disabilities: Secondary | ICD-10-CM | POA: Diagnosis not present

## 2016-12-01 DIAGNOSIS — F79 Unspecified intellectual disabilities: Secondary | ICD-10-CM | POA: Diagnosis not present

## 2016-12-02 DIAGNOSIS — F79 Unspecified intellectual disabilities: Secondary | ICD-10-CM | POA: Diagnosis not present

## 2016-12-03 DIAGNOSIS — E785 Hyperlipidemia, unspecified: Secondary | ICD-10-CM | POA: Diagnosis not present

## 2016-12-03 DIAGNOSIS — I10 Essential (primary) hypertension: Secondary | ICD-10-CM | POA: Diagnosis not present

## 2016-12-03 DIAGNOSIS — I509 Heart failure, unspecified: Secondary | ICD-10-CM | POA: Diagnosis not present

## 2016-12-03 DIAGNOSIS — E039 Hypothyroidism, unspecified: Secondary | ICD-10-CM | POA: Diagnosis not present

## 2016-12-03 DIAGNOSIS — F79 Unspecified intellectual disabilities: Secondary | ICD-10-CM | POA: Diagnosis not present

## 2016-12-04 DIAGNOSIS — F79 Unspecified intellectual disabilities: Secondary | ICD-10-CM | POA: Diagnosis not present

## 2016-12-05 DIAGNOSIS — F79 Unspecified intellectual disabilities: Secondary | ICD-10-CM | POA: Diagnosis not present

## 2016-12-06 DIAGNOSIS — F79 Unspecified intellectual disabilities: Secondary | ICD-10-CM | POA: Diagnosis not present

## 2016-12-07 DIAGNOSIS — F79 Unspecified intellectual disabilities: Secondary | ICD-10-CM | POA: Diagnosis not present

## 2016-12-08 DIAGNOSIS — D518 Other vitamin B12 deficiency anemias: Secondary | ICD-10-CM | POA: Diagnosis not present

## 2016-12-08 DIAGNOSIS — E559 Vitamin D deficiency, unspecified: Secondary | ICD-10-CM | POA: Diagnosis not present

## 2016-12-08 DIAGNOSIS — E038 Other specified hypothyroidism: Secondary | ICD-10-CM | POA: Diagnosis not present

## 2016-12-08 DIAGNOSIS — E119 Type 2 diabetes mellitus without complications: Secondary | ICD-10-CM | POA: Diagnosis not present

## 2016-12-08 DIAGNOSIS — E782 Mixed hyperlipidemia: Secondary | ICD-10-CM | POA: Diagnosis not present

## 2016-12-08 DIAGNOSIS — Z79899 Other long term (current) drug therapy: Secondary | ICD-10-CM | POA: Diagnosis not present

## 2016-12-08 DIAGNOSIS — F79 Unspecified intellectual disabilities: Secondary | ICD-10-CM | POA: Diagnosis not present

## 2016-12-09 DIAGNOSIS — F79 Unspecified intellectual disabilities: Secondary | ICD-10-CM | POA: Diagnosis not present

## 2016-12-10 DIAGNOSIS — F79 Unspecified intellectual disabilities: Secondary | ICD-10-CM | POA: Diagnosis not present

## 2016-12-11 DIAGNOSIS — F79 Unspecified intellectual disabilities: Secondary | ICD-10-CM | POA: Diagnosis not present

## 2016-12-12 DIAGNOSIS — F79 Unspecified intellectual disabilities: Secondary | ICD-10-CM | POA: Diagnosis not present

## 2016-12-13 DIAGNOSIS — F79 Unspecified intellectual disabilities: Secondary | ICD-10-CM | POA: Diagnosis not present

## 2016-12-14 DIAGNOSIS — F79 Unspecified intellectual disabilities: Secondary | ICD-10-CM | POA: Diagnosis not present

## 2016-12-15 DIAGNOSIS — F79 Unspecified intellectual disabilities: Secondary | ICD-10-CM | POA: Diagnosis not present

## 2016-12-16 DIAGNOSIS — F79 Unspecified intellectual disabilities: Secondary | ICD-10-CM | POA: Diagnosis not present

## 2016-12-17 DIAGNOSIS — F79 Unspecified intellectual disabilities: Secondary | ICD-10-CM | POA: Diagnosis not present

## 2016-12-18 DIAGNOSIS — F79 Unspecified intellectual disabilities: Secondary | ICD-10-CM | POA: Diagnosis not present

## 2016-12-19 DIAGNOSIS — F79 Unspecified intellectual disabilities: Secondary | ICD-10-CM | POA: Diagnosis not present

## 2016-12-20 DIAGNOSIS — F79 Unspecified intellectual disabilities: Secondary | ICD-10-CM | POA: Diagnosis not present

## 2016-12-21 DIAGNOSIS — F79 Unspecified intellectual disabilities: Secondary | ICD-10-CM | POA: Diagnosis not present

## 2016-12-22 DIAGNOSIS — F79 Unspecified intellectual disabilities: Secondary | ICD-10-CM | POA: Diagnosis not present

## 2016-12-23 DIAGNOSIS — F79 Unspecified intellectual disabilities: Secondary | ICD-10-CM | POA: Diagnosis not present

## 2016-12-24 DIAGNOSIS — F79 Unspecified intellectual disabilities: Secondary | ICD-10-CM | POA: Diagnosis not present

## 2016-12-25 DIAGNOSIS — F79 Unspecified intellectual disabilities: Secondary | ICD-10-CM | POA: Diagnosis not present

## 2016-12-26 DIAGNOSIS — F79 Unspecified intellectual disabilities: Secondary | ICD-10-CM | POA: Diagnosis not present

## 2016-12-27 DIAGNOSIS — F79 Unspecified intellectual disabilities: Secondary | ICD-10-CM | POA: Diagnosis not present

## 2016-12-28 DIAGNOSIS — F79 Unspecified intellectual disabilities: Secondary | ICD-10-CM | POA: Diagnosis not present

## 2016-12-29 DIAGNOSIS — F79 Unspecified intellectual disabilities: Secondary | ICD-10-CM | POA: Diagnosis not present

## 2016-12-30 DIAGNOSIS — F79 Unspecified intellectual disabilities: Secondary | ICD-10-CM | POA: Diagnosis not present

## 2016-12-31 DIAGNOSIS — F79 Unspecified intellectual disabilities: Secondary | ICD-10-CM | POA: Diagnosis not present

## 2017-01-01 DIAGNOSIS — F79 Unspecified intellectual disabilities: Secondary | ICD-10-CM | POA: Diagnosis not present

## 2017-01-02 DIAGNOSIS — F79 Unspecified intellectual disabilities: Secondary | ICD-10-CM | POA: Diagnosis not present

## 2017-01-03 DIAGNOSIS — F79 Unspecified intellectual disabilities: Secondary | ICD-10-CM | POA: Diagnosis not present

## 2017-01-04 DIAGNOSIS — F79 Unspecified intellectual disabilities: Secondary | ICD-10-CM | POA: Diagnosis not present

## 2017-01-05 DIAGNOSIS — F79 Unspecified intellectual disabilities: Secondary | ICD-10-CM | POA: Diagnosis not present

## 2017-01-06 DIAGNOSIS — F79 Unspecified intellectual disabilities: Secondary | ICD-10-CM | POA: Diagnosis not present

## 2017-01-07 DIAGNOSIS — F79 Unspecified intellectual disabilities: Secondary | ICD-10-CM | POA: Diagnosis not present

## 2017-01-08 DIAGNOSIS — F79 Unspecified intellectual disabilities: Secondary | ICD-10-CM | POA: Diagnosis not present

## 2017-01-09 DIAGNOSIS — F79 Unspecified intellectual disabilities: Secondary | ICD-10-CM | POA: Diagnosis not present

## 2017-01-10 DIAGNOSIS — F79 Unspecified intellectual disabilities: Secondary | ICD-10-CM | POA: Diagnosis not present

## 2017-01-11 DIAGNOSIS — F79 Unspecified intellectual disabilities: Secondary | ICD-10-CM | POA: Diagnosis not present

## 2017-01-12 DIAGNOSIS — R32 Unspecified urinary incontinence: Secondary | ICD-10-CM | POA: Diagnosis not present

## 2017-01-12 DIAGNOSIS — F79 Unspecified intellectual disabilities: Secondary | ICD-10-CM | POA: Diagnosis not present

## 2017-01-13 DIAGNOSIS — F79 Unspecified intellectual disabilities: Secondary | ICD-10-CM | POA: Diagnosis not present

## 2017-01-14 DIAGNOSIS — F79 Unspecified intellectual disabilities: Secondary | ICD-10-CM | POA: Diagnosis not present

## 2017-01-15 DIAGNOSIS — F79 Unspecified intellectual disabilities: Secondary | ICD-10-CM | POA: Diagnosis not present

## 2017-01-16 DIAGNOSIS — F79 Unspecified intellectual disabilities: Secondary | ICD-10-CM | POA: Diagnosis not present

## 2017-01-17 DIAGNOSIS — F79 Unspecified intellectual disabilities: Secondary | ICD-10-CM | POA: Diagnosis not present

## 2017-01-18 DIAGNOSIS — F79 Unspecified intellectual disabilities: Secondary | ICD-10-CM | POA: Diagnosis not present

## 2017-01-19 DIAGNOSIS — F79 Unspecified intellectual disabilities: Secondary | ICD-10-CM | POA: Diagnosis not present

## 2017-01-20 DIAGNOSIS — F79 Unspecified intellectual disabilities: Secondary | ICD-10-CM | POA: Diagnosis not present

## 2017-01-21 DIAGNOSIS — F79 Unspecified intellectual disabilities: Secondary | ICD-10-CM | POA: Diagnosis not present

## 2017-01-22 DIAGNOSIS — F79 Unspecified intellectual disabilities: Secondary | ICD-10-CM | POA: Diagnosis not present

## 2017-01-22 IMAGING — CR DG CHEST 2V
2 series · 2 of 2 positions shown · non-contrast
Comparison: No priors.

CLINICAL DATA: 69-year-old female with shortness of breath.

EXAM:
CHEST  2 VIEW

[view not recorded (1 of 2)]
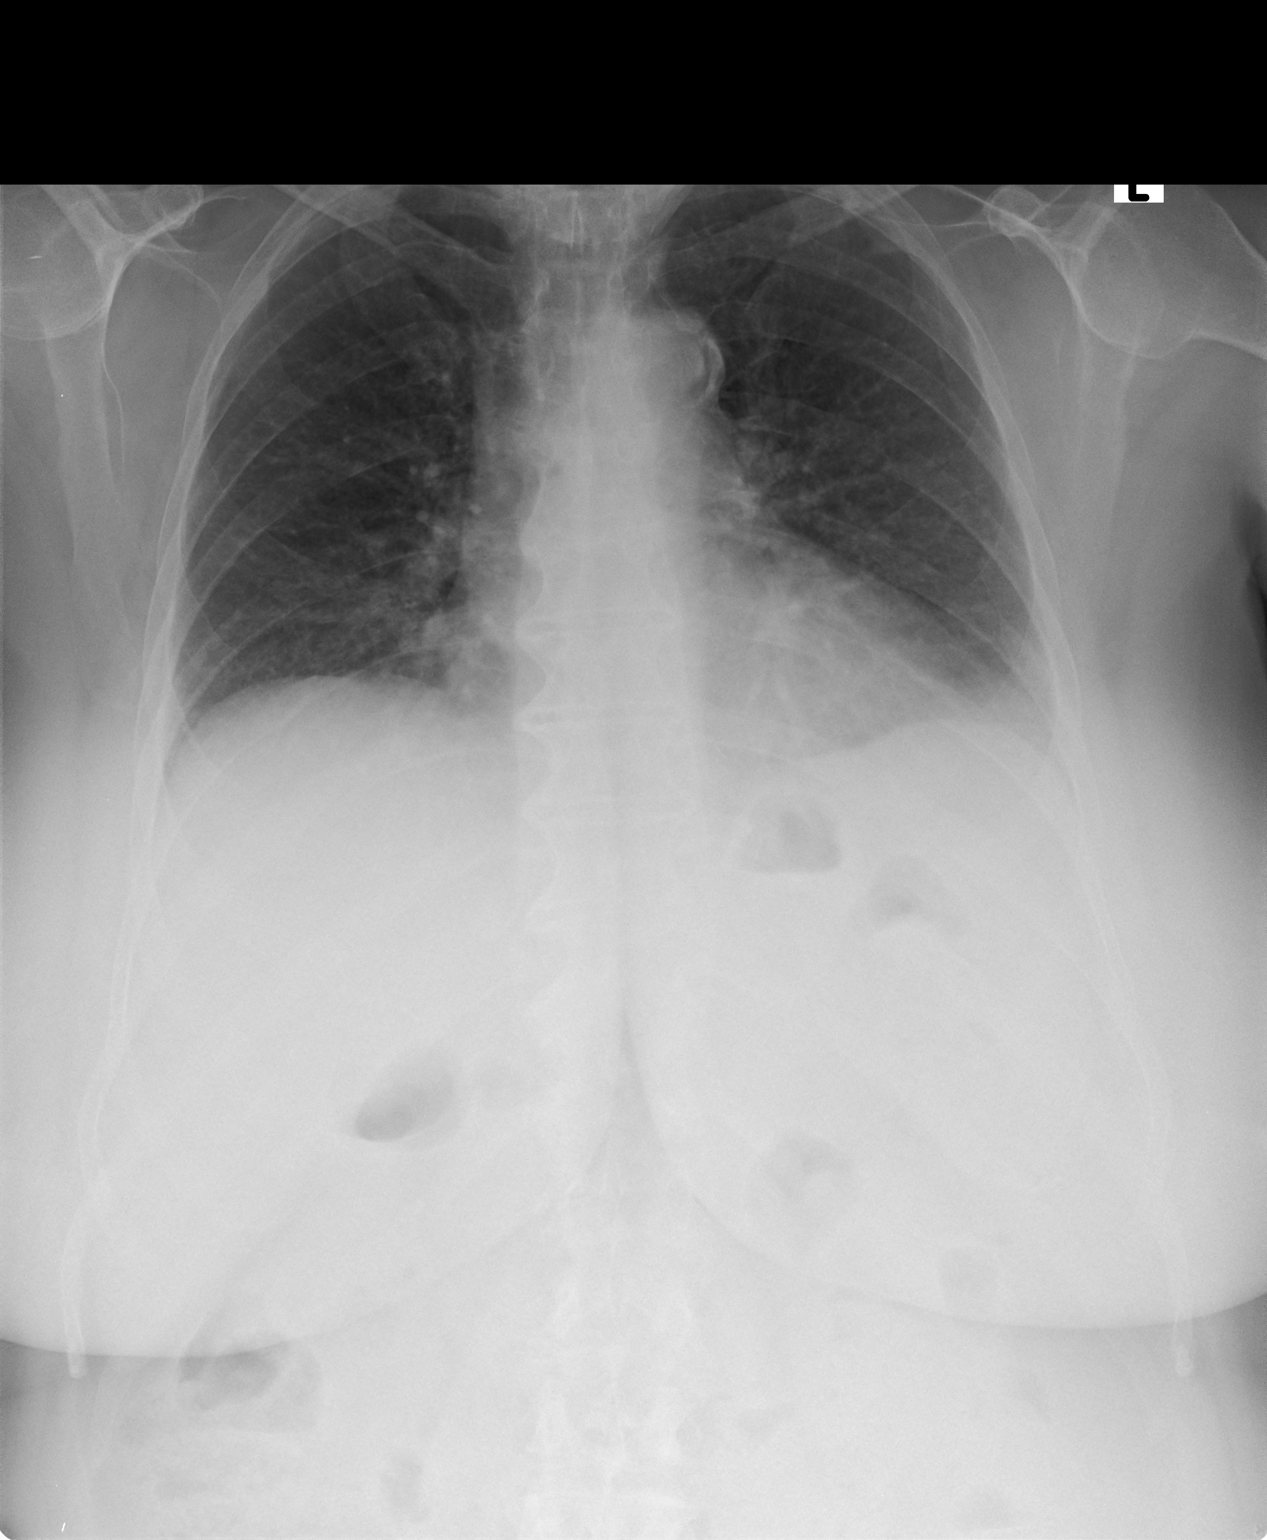

[view not recorded (2 of 2)]
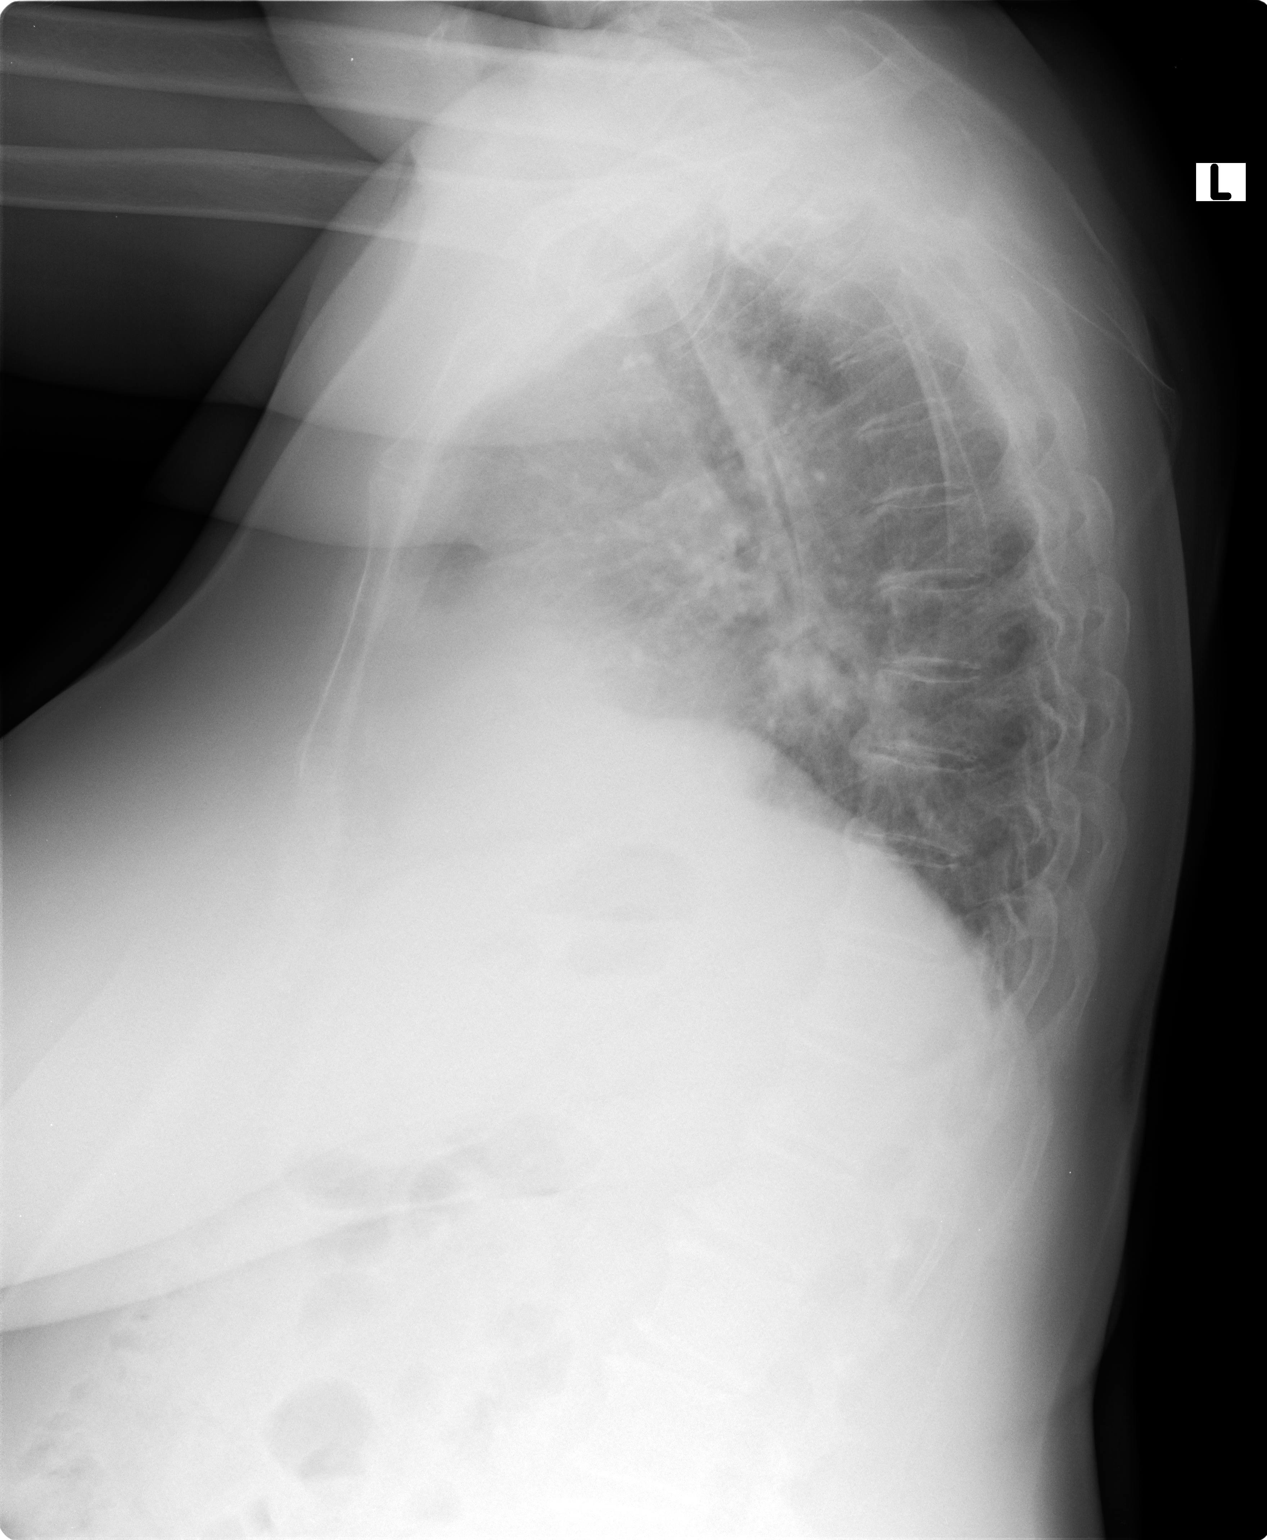

[2 of 2 positions shown; findings below may reference images not displayed]

FINDINGS: Lung volumes are low. No consolidative airspace disease. Bibasilar
opacities presumably represent subsegmental atelectasis. No pleural
effusions. No pneumothorax. No pulmonary nodule or mass noted.
Pulmonary vasculature and the cardiomediastinal silhouette are
within normal limits. Atherosclerosis in the thoracic aorta.
IMPRESSION: 1. Low lung volumes without radiographic evidence of acute
cardiopulmonary disease.
2. Atherosclerosis.

## 2017-01-23 DIAGNOSIS — F79 Unspecified intellectual disabilities: Secondary | ICD-10-CM | POA: Diagnosis not present

## 2017-01-24 DIAGNOSIS — F79 Unspecified intellectual disabilities: Secondary | ICD-10-CM | POA: Diagnosis not present

## 2017-01-25 DIAGNOSIS — F79 Unspecified intellectual disabilities: Secondary | ICD-10-CM | POA: Diagnosis not present

## 2017-01-26 DIAGNOSIS — F79 Unspecified intellectual disabilities: Secondary | ICD-10-CM | POA: Diagnosis not present

## 2017-01-27 DIAGNOSIS — F79 Unspecified intellectual disabilities: Secondary | ICD-10-CM | POA: Diagnosis not present

## 2017-01-28 DIAGNOSIS — I509 Heart failure, unspecified: Secondary | ICD-10-CM | POA: Diagnosis not present

## 2017-01-28 DIAGNOSIS — R32 Unspecified urinary incontinence: Secondary | ICD-10-CM | POA: Diagnosis not present

## 2017-01-28 DIAGNOSIS — B373 Candidiasis of vulva and vagina: Secondary | ICD-10-CM | POA: Diagnosis not present

## 2017-01-28 DIAGNOSIS — F79 Unspecified intellectual disabilities: Secondary | ICD-10-CM | POA: Diagnosis not present

## 2017-01-28 DIAGNOSIS — I10 Essential (primary) hypertension: Secondary | ICD-10-CM | POA: Diagnosis not present

## 2017-01-29 DIAGNOSIS — F79 Unspecified intellectual disabilities: Secondary | ICD-10-CM | POA: Diagnosis not present

## 2017-01-30 DIAGNOSIS — F79 Unspecified intellectual disabilities: Secondary | ICD-10-CM | POA: Diagnosis not present

## 2017-01-31 DIAGNOSIS — F79 Unspecified intellectual disabilities: Secondary | ICD-10-CM | POA: Diagnosis not present

## 2017-02-01 DIAGNOSIS — F79 Unspecified intellectual disabilities: Secondary | ICD-10-CM | POA: Diagnosis not present

## 2017-02-02 DIAGNOSIS — F79 Unspecified intellectual disabilities: Secondary | ICD-10-CM | POA: Diagnosis not present

## 2017-02-03 DIAGNOSIS — N390 Urinary tract infection, site not specified: Secondary | ICD-10-CM | POA: Diagnosis not present

## 2017-02-03 DIAGNOSIS — F79 Unspecified intellectual disabilities: Secondary | ICD-10-CM | POA: Diagnosis not present

## 2017-02-04 DIAGNOSIS — F79 Unspecified intellectual disabilities: Secondary | ICD-10-CM | POA: Diagnosis not present

## 2017-02-05 DIAGNOSIS — F79 Unspecified intellectual disabilities: Secondary | ICD-10-CM | POA: Diagnosis not present

## 2017-02-06 DIAGNOSIS — F79 Unspecified intellectual disabilities: Secondary | ICD-10-CM | POA: Diagnosis not present

## 2017-02-07 DIAGNOSIS — F79 Unspecified intellectual disabilities: Secondary | ICD-10-CM | POA: Diagnosis not present

## 2017-02-08 DIAGNOSIS — F79 Unspecified intellectual disabilities: Secondary | ICD-10-CM | POA: Diagnosis not present

## 2017-02-09 DIAGNOSIS — F79 Unspecified intellectual disabilities: Secondary | ICD-10-CM | POA: Diagnosis not present

## 2017-02-10 DIAGNOSIS — R32 Unspecified urinary incontinence: Secondary | ICD-10-CM | POA: Diagnosis not present

## 2017-02-10 DIAGNOSIS — F79 Unspecified intellectual disabilities: Secondary | ICD-10-CM | POA: Diagnosis not present

## 2017-02-11 DIAGNOSIS — F79 Unspecified intellectual disabilities: Secondary | ICD-10-CM | POA: Diagnosis not present

## 2017-02-11 DIAGNOSIS — N3000 Acute cystitis without hematuria: Secondary | ICD-10-CM | POA: Diagnosis not present

## 2017-02-11 DIAGNOSIS — R32 Unspecified urinary incontinence: Secondary | ICD-10-CM | POA: Diagnosis not present

## 2017-02-12 DIAGNOSIS — F79 Unspecified intellectual disabilities: Secondary | ICD-10-CM | POA: Diagnosis not present

## 2017-02-13 DIAGNOSIS — F79 Unspecified intellectual disabilities: Secondary | ICD-10-CM | POA: Diagnosis not present

## 2017-02-14 DIAGNOSIS — F79 Unspecified intellectual disabilities: Secondary | ICD-10-CM | POA: Diagnosis not present

## 2017-02-15 DIAGNOSIS — F79 Unspecified intellectual disabilities: Secondary | ICD-10-CM | POA: Diagnosis not present

## 2017-02-16 DIAGNOSIS — F79 Unspecified intellectual disabilities: Secondary | ICD-10-CM | POA: Diagnosis not present

## 2017-02-17 DIAGNOSIS — F79 Unspecified intellectual disabilities: Secondary | ICD-10-CM | POA: Diagnosis not present

## 2017-02-18 DIAGNOSIS — F79 Unspecified intellectual disabilities: Secondary | ICD-10-CM | POA: Diagnosis not present

## 2017-02-19 DIAGNOSIS — F79 Unspecified intellectual disabilities: Secondary | ICD-10-CM | POA: Diagnosis not present

## 2017-02-20 DIAGNOSIS — F79 Unspecified intellectual disabilities: Secondary | ICD-10-CM | POA: Diagnosis not present

## 2017-02-21 DIAGNOSIS — F79 Unspecified intellectual disabilities: Secondary | ICD-10-CM | POA: Diagnosis not present

## 2017-02-22 DIAGNOSIS — F79 Unspecified intellectual disabilities: Secondary | ICD-10-CM | POA: Diagnosis not present

## 2017-02-23 DIAGNOSIS — F79 Unspecified intellectual disabilities: Secondary | ICD-10-CM | POA: Diagnosis not present

## 2017-02-24 DIAGNOSIS — B351 Tinea unguium: Secondary | ICD-10-CM | POA: Diagnosis not present

## 2017-02-24 DIAGNOSIS — M19072 Primary osteoarthritis, left ankle and foot: Secondary | ICD-10-CM | POA: Diagnosis not present

## 2017-02-24 DIAGNOSIS — F79 Unspecified intellectual disabilities: Secondary | ICD-10-CM | POA: Diagnosis not present

## 2017-02-24 DIAGNOSIS — I739 Peripheral vascular disease, unspecified: Secondary | ICD-10-CM | POA: Diagnosis not present

## 2017-02-25 DIAGNOSIS — F79 Unspecified intellectual disabilities: Secondary | ICD-10-CM | POA: Diagnosis not present

## 2017-02-26 DIAGNOSIS — F79 Unspecified intellectual disabilities: Secondary | ICD-10-CM | POA: Diagnosis not present

## 2017-02-27 DIAGNOSIS — F79 Unspecified intellectual disabilities: Secondary | ICD-10-CM | POA: Diagnosis not present

## 2017-02-28 DIAGNOSIS — F79 Unspecified intellectual disabilities: Secondary | ICD-10-CM | POA: Diagnosis not present

## 2017-03-01 DIAGNOSIS — F79 Unspecified intellectual disabilities: Secondary | ICD-10-CM | POA: Diagnosis not present

## 2017-03-02 DIAGNOSIS — E559 Vitamin D deficiency, unspecified: Secondary | ICD-10-CM | POA: Diagnosis not present

## 2017-03-02 DIAGNOSIS — F79 Unspecified intellectual disabilities: Secondary | ICD-10-CM | POA: Diagnosis not present

## 2017-03-02 DIAGNOSIS — Z79899 Other long term (current) drug therapy: Secondary | ICD-10-CM | POA: Diagnosis not present

## 2017-03-02 DIAGNOSIS — D518 Other vitamin B12 deficiency anemias: Secondary | ICD-10-CM | POA: Diagnosis not present

## 2017-03-02 DIAGNOSIS — E119 Type 2 diabetes mellitus without complications: Secondary | ICD-10-CM | POA: Diagnosis not present

## 2017-03-02 DIAGNOSIS — E038 Other specified hypothyroidism: Secondary | ICD-10-CM | POA: Diagnosis not present

## 2017-03-03 DIAGNOSIS — F79 Unspecified intellectual disabilities: Secondary | ICD-10-CM | POA: Diagnosis not present

## 2017-03-04 DIAGNOSIS — F79 Unspecified intellectual disabilities: Secondary | ICD-10-CM | POA: Diagnosis not present

## 2017-03-05 DIAGNOSIS — F79 Unspecified intellectual disabilities: Secondary | ICD-10-CM | POA: Diagnosis not present

## 2017-03-06 DIAGNOSIS — F79 Unspecified intellectual disabilities: Secondary | ICD-10-CM | POA: Diagnosis not present

## 2017-03-07 DIAGNOSIS — F79 Unspecified intellectual disabilities: Secondary | ICD-10-CM | POA: Diagnosis not present

## 2017-03-08 DIAGNOSIS — F79 Unspecified intellectual disabilities: Secondary | ICD-10-CM | POA: Diagnosis not present

## 2017-03-09 DIAGNOSIS — F79 Unspecified intellectual disabilities: Secondary | ICD-10-CM | POA: Diagnosis not present

## 2017-03-10 DIAGNOSIS — F79 Unspecified intellectual disabilities: Secondary | ICD-10-CM | POA: Diagnosis not present

## 2017-03-11 DIAGNOSIS — F79 Unspecified intellectual disabilities: Secondary | ICD-10-CM | POA: Diagnosis not present

## 2017-03-12 DIAGNOSIS — F79 Unspecified intellectual disabilities: Secondary | ICD-10-CM | POA: Diagnosis not present

## 2017-03-13 DIAGNOSIS — F79 Unspecified intellectual disabilities: Secondary | ICD-10-CM | POA: Diagnosis not present

## 2017-03-14 DIAGNOSIS — F79 Unspecified intellectual disabilities: Secondary | ICD-10-CM | POA: Diagnosis not present

## 2017-03-15 DIAGNOSIS — F79 Unspecified intellectual disabilities: Secondary | ICD-10-CM | POA: Diagnosis not present

## 2017-03-16 DIAGNOSIS — F79 Unspecified intellectual disabilities: Secondary | ICD-10-CM | POA: Diagnosis not present

## 2017-03-17 DIAGNOSIS — F79 Unspecified intellectual disabilities: Secondary | ICD-10-CM | POA: Diagnosis not present

## 2017-03-18 DIAGNOSIS — F79 Unspecified intellectual disabilities: Secondary | ICD-10-CM | POA: Diagnosis not present

## 2017-03-19 DIAGNOSIS — F79 Unspecified intellectual disabilities: Secondary | ICD-10-CM | POA: Diagnosis not present

## 2017-03-20 DIAGNOSIS — F79 Unspecified intellectual disabilities: Secondary | ICD-10-CM | POA: Diagnosis not present

## 2017-03-21 DIAGNOSIS — F79 Unspecified intellectual disabilities: Secondary | ICD-10-CM | POA: Diagnosis not present

## 2017-03-22 DIAGNOSIS — F79 Unspecified intellectual disabilities: Secondary | ICD-10-CM | POA: Diagnosis not present

## 2017-03-23 DIAGNOSIS — F79 Unspecified intellectual disabilities: Secondary | ICD-10-CM | POA: Diagnosis not present

## 2017-03-24 DIAGNOSIS — F79 Unspecified intellectual disabilities: Secondary | ICD-10-CM | POA: Diagnosis not present

## 2017-03-25 DIAGNOSIS — F79 Unspecified intellectual disabilities: Secondary | ICD-10-CM | POA: Diagnosis not present

## 2017-03-26 DIAGNOSIS — N390 Urinary tract infection, site not specified: Secondary | ICD-10-CM | POA: Diagnosis not present

## 2017-03-26 DIAGNOSIS — B373 Candidiasis of vulva and vagina: Secondary | ICD-10-CM | POA: Diagnosis not present

## 2017-03-26 DIAGNOSIS — I509 Heart failure, unspecified: Secondary | ICD-10-CM | POA: Diagnosis not present

## 2017-03-26 DIAGNOSIS — R32 Unspecified urinary incontinence: Secondary | ICD-10-CM | POA: Diagnosis not present

## 2017-03-26 DIAGNOSIS — I10 Essential (primary) hypertension: Secondary | ICD-10-CM | POA: Diagnosis not present

## 2017-03-26 DIAGNOSIS — F79 Unspecified intellectual disabilities: Secondary | ICD-10-CM | POA: Diagnosis not present

## 2017-03-27 DIAGNOSIS — F79 Unspecified intellectual disabilities: Secondary | ICD-10-CM | POA: Diagnosis not present

## 2017-03-28 DIAGNOSIS — F79 Unspecified intellectual disabilities: Secondary | ICD-10-CM | POA: Diagnosis not present

## 2017-03-29 DIAGNOSIS — F79 Unspecified intellectual disabilities: Secondary | ICD-10-CM | POA: Diagnosis not present

## 2017-03-30 DIAGNOSIS — F79 Unspecified intellectual disabilities: Secondary | ICD-10-CM | POA: Diagnosis not present

## 2017-03-31 DIAGNOSIS — F79 Unspecified intellectual disabilities: Secondary | ICD-10-CM | POA: Diagnosis not present

## 2017-04-01 DIAGNOSIS — F79 Unspecified intellectual disabilities: Secondary | ICD-10-CM | POA: Diagnosis not present

## 2017-04-02 DIAGNOSIS — F79 Unspecified intellectual disabilities: Secondary | ICD-10-CM | POA: Diagnosis not present

## 2017-04-03 DIAGNOSIS — F79 Unspecified intellectual disabilities: Secondary | ICD-10-CM | POA: Diagnosis not present

## 2017-04-04 DIAGNOSIS — F79 Unspecified intellectual disabilities: Secondary | ICD-10-CM | POA: Diagnosis not present

## 2017-04-05 DIAGNOSIS — F79 Unspecified intellectual disabilities: Secondary | ICD-10-CM | POA: Diagnosis not present

## 2017-04-06 DIAGNOSIS — F79 Unspecified intellectual disabilities: Secondary | ICD-10-CM | POA: Diagnosis not present

## 2017-04-07 DIAGNOSIS — F79 Unspecified intellectual disabilities: Secondary | ICD-10-CM | POA: Diagnosis not present

## 2017-04-09 DIAGNOSIS — F79 Unspecified intellectual disabilities: Secondary | ICD-10-CM | POA: Diagnosis not present

## 2017-04-10 DIAGNOSIS — F79 Unspecified intellectual disabilities: Secondary | ICD-10-CM | POA: Diagnosis not present

## 2017-04-11 DIAGNOSIS — F79 Unspecified intellectual disabilities: Secondary | ICD-10-CM | POA: Diagnosis not present

## 2017-04-12 DIAGNOSIS — F79 Unspecified intellectual disabilities: Secondary | ICD-10-CM | POA: Diagnosis not present

## 2017-04-13 DIAGNOSIS — F79 Unspecified intellectual disabilities: Secondary | ICD-10-CM | POA: Diagnosis not present

## 2017-04-13 DIAGNOSIS — R32 Unspecified urinary incontinence: Secondary | ICD-10-CM | POA: Diagnosis not present

## 2017-04-14 DIAGNOSIS — F79 Unspecified intellectual disabilities: Secondary | ICD-10-CM | POA: Diagnosis not present

## 2017-04-15 DIAGNOSIS — I10 Essential (primary) hypertension: Secondary | ICD-10-CM | POA: Diagnosis not present

## 2017-04-15 DIAGNOSIS — E785 Hyperlipidemia, unspecified: Secondary | ICD-10-CM | POA: Diagnosis not present

## 2017-04-15 DIAGNOSIS — E039 Hypothyroidism, unspecified: Secondary | ICD-10-CM | POA: Diagnosis not present

## 2017-04-15 DIAGNOSIS — F79 Unspecified intellectual disabilities: Secondary | ICD-10-CM | POA: Diagnosis not present

## 2017-04-15 DIAGNOSIS — Z0000 Encounter for general adult medical examination without abnormal findings: Secondary | ICD-10-CM | POA: Diagnosis not present

## 2017-04-15 DIAGNOSIS — M150 Primary generalized (osteo)arthritis: Secondary | ICD-10-CM | POA: Diagnosis not present

## 2017-04-16 DIAGNOSIS — F79 Unspecified intellectual disabilities: Secondary | ICD-10-CM | POA: Diagnosis not present

## 2017-04-17 DIAGNOSIS — F79 Unspecified intellectual disabilities: Secondary | ICD-10-CM | POA: Diagnosis not present

## 2017-04-18 DIAGNOSIS — F79 Unspecified intellectual disabilities: Secondary | ICD-10-CM | POA: Diagnosis not present

## 2017-04-19 DIAGNOSIS — F79 Unspecified intellectual disabilities: Secondary | ICD-10-CM | POA: Diagnosis not present

## 2017-04-20 DIAGNOSIS — F79 Unspecified intellectual disabilities: Secondary | ICD-10-CM | POA: Diagnosis not present

## 2017-04-21 DIAGNOSIS — F79 Unspecified intellectual disabilities: Secondary | ICD-10-CM | POA: Diagnosis not present

## 2017-04-22 DIAGNOSIS — F79 Unspecified intellectual disabilities: Secondary | ICD-10-CM | POA: Diagnosis not present

## 2017-04-23 DIAGNOSIS — F79 Unspecified intellectual disabilities: Secondary | ICD-10-CM | POA: Diagnosis not present

## 2017-04-24 DIAGNOSIS — F79 Unspecified intellectual disabilities: Secondary | ICD-10-CM | POA: Diagnosis not present

## 2017-04-25 DIAGNOSIS — F79 Unspecified intellectual disabilities: Secondary | ICD-10-CM | POA: Diagnosis not present

## 2017-04-26 DIAGNOSIS — F79 Unspecified intellectual disabilities: Secondary | ICD-10-CM | POA: Diagnosis not present

## 2017-04-27 DIAGNOSIS — F79 Unspecified intellectual disabilities: Secondary | ICD-10-CM | POA: Diagnosis not present

## 2017-04-28 DIAGNOSIS — F79 Unspecified intellectual disabilities: Secondary | ICD-10-CM | POA: Diagnosis not present

## 2017-04-29 DIAGNOSIS — I10 Essential (primary) hypertension: Secondary | ICD-10-CM | POA: Diagnosis not present

## 2017-04-29 DIAGNOSIS — I509 Heart failure, unspecified: Secondary | ICD-10-CM | POA: Diagnosis not present

## 2017-04-29 DIAGNOSIS — F79 Unspecified intellectual disabilities: Secondary | ICD-10-CM | POA: Diagnosis not present

## 2017-04-29 DIAGNOSIS — R32 Unspecified urinary incontinence: Secondary | ICD-10-CM | POA: Diagnosis not present

## 2017-04-29 DIAGNOSIS — I739 Peripheral vascular disease, unspecified: Secondary | ICD-10-CM | POA: Diagnosis not present

## 2017-04-30 DIAGNOSIS — F79 Unspecified intellectual disabilities: Secondary | ICD-10-CM | POA: Diagnosis not present

## 2017-05-01 DIAGNOSIS — F79 Unspecified intellectual disabilities: Secondary | ICD-10-CM | POA: Diagnosis not present

## 2017-05-02 DIAGNOSIS — F79 Unspecified intellectual disabilities: Secondary | ICD-10-CM | POA: Diagnosis not present

## 2017-05-03 DIAGNOSIS — F79 Unspecified intellectual disabilities: Secondary | ICD-10-CM | POA: Diagnosis not present

## 2017-05-04 DIAGNOSIS — F79 Unspecified intellectual disabilities: Secondary | ICD-10-CM | POA: Diagnosis not present

## 2017-05-05 DIAGNOSIS — F79 Unspecified intellectual disabilities: Secondary | ICD-10-CM | POA: Diagnosis not present

## 2017-05-06 DIAGNOSIS — F79 Unspecified intellectual disabilities: Secondary | ICD-10-CM | POA: Diagnosis not present

## 2017-05-07 DIAGNOSIS — F79 Unspecified intellectual disabilities: Secondary | ICD-10-CM | POA: Diagnosis not present

## 2017-05-08 DIAGNOSIS — F79 Unspecified intellectual disabilities: Secondary | ICD-10-CM | POA: Diagnosis not present

## 2017-05-09 DIAGNOSIS — F79 Unspecified intellectual disabilities: Secondary | ICD-10-CM | POA: Diagnosis not present

## 2017-05-11 DIAGNOSIS — F79 Unspecified intellectual disabilities: Secondary | ICD-10-CM | POA: Diagnosis not present

## 2017-05-12 DIAGNOSIS — R32 Unspecified urinary incontinence: Secondary | ICD-10-CM | POA: Diagnosis not present

## 2017-05-12 DIAGNOSIS — F79 Unspecified intellectual disabilities: Secondary | ICD-10-CM | POA: Diagnosis not present

## 2017-05-13 DIAGNOSIS — F79 Unspecified intellectual disabilities: Secondary | ICD-10-CM | POA: Diagnosis not present

## 2017-05-14 DIAGNOSIS — F79 Unspecified intellectual disabilities: Secondary | ICD-10-CM | POA: Diagnosis not present

## 2017-05-15 DIAGNOSIS — F79 Unspecified intellectual disabilities: Secondary | ICD-10-CM | POA: Diagnosis not present

## 2017-05-16 DIAGNOSIS — F79 Unspecified intellectual disabilities: Secondary | ICD-10-CM | POA: Diagnosis not present

## 2017-05-17 DIAGNOSIS — F79 Unspecified intellectual disabilities: Secondary | ICD-10-CM | POA: Diagnosis not present

## 2017-05-18 DIAGNOSIS — F79 Unspecified intellectual disabilities: Secondary | ICD-10-CM | POA: Diagnosis not present

## 2017-05-19 DIAGNOSIS — F79 Unspecified intellectual disabilities: Secondary | ICD-10-CM | POA: Diagnosis not present

## 2017-05-20 DIAGNOSIS — F79 Unspecified intellectual disabilities: Secondary | ICD-10-CM | POA: Diagnosis not present

## 2017-05-21 DIAGNOSIS — F79 Unspecified intellectual disabilities: Secondary | ICD-10-CM | POA: Diagnosis not present

## 2017-05-21 DIAGNOSIS — L84 Corns and callosities: Secondary | ICD-10-CM | POA: Diagnosis not present

## 2017-05-21 DIAGNOSIS — I739 Peripheral vascular disease, unspecified: Secondary | ICD-10-CM | POA: Diagnosis not present

## 2017-05-21 DIAGNOSIS — B351 Tinea unguium: Secondary | ICD-10-CM | POA: Diagnosis not present

## 2017-05-22 DIAGNOSIS — F79 Unspecified intellectual disabilities: Secondary | ICD-10-CM | POA: Diagnosis not present

## 2017-05-23 DIAGNOSIS — F79 Unspecified intellectual disabilities: Secondary | ICD-10-CM | POA: Diagnosis not present

## 2017-05-24 DIAGNOSIS — F79 Unspecified intellectual disabilities: Secondary | ICD-10-CM | POA: Diagnosis not present

## 2017-05-25 DIAGNOSIS — F79 Unspecified intellectual disabilities: Secondary | ICD-10-CM | POA: Diagnosis not present

## 2017-05-26 DIAGNOSIS — F79 Unspecified intellectual disabilities: Secondary | ICD-10-CM | POA: Diagnosis not present

## 2017-05-27 DIAGNOSIS — I10 Essential (primary) hypertension: Secondary | ICD-10-CM | POA: Diagnosis not present

## 2017-05-27 DIAGNOSIS — F79 Unspecified intellectual disabilities: Secondary | ICD-10-CM | POA: Diagnosis not present

## 2017-05-27 DIAGNOSIS — I509 Heart failure, unspecified: Secondary | ICD-10-CM | POA: Diagnosis not present

## 2017-05-27 DIAGNOSIS — I739 Peripheral vascular disease, unspecified: Secondary | ICD-10-CM | POA: Diagnosis not present

## 2017-05-30 DIAGNOSIS — F79 Unspecified intellectual disabilities: Secondary | ICD-10-CM | POA: Diagnosis not present

## 2017-05-31 DIAGNOSIS — F79 Unspecified intellectual disabilities: Secondary | ICD-10-CM | POA: Diagnosis not present

## 2017-06-01 DIAGNOSIS — F79 Unspecified intellectual disabilities: Secondary | ICD-10-CM | POA: Diagnosis not present

## 2017-06-02 DIAGNOSIS — F79 Unspecified intellectual disabilities: Secondary | ICD-10-CM | POA: Diagnosis not present

## 2017-06-03 DIAGNOSIS — D518 Other vitamin B12 deficiency anemias: Secondary | ICD-10-CM | POA: Diagnosis not present

## 2017-06-03 DIAGNOSIS — E7849 Other hyperlipidemia: Secondary | ICD-10-CM | POA: Diagnosis not present

## 2017-06-03 DIAGNOSIS — Z79899 Other long term (current) drug therapy: Secondary | ICD-10-CM | POA: Diagnosis not present

## 2017-06-03 DIAGNOSIS — F79 Unspecified intellectual disabilities: Secondary | ICD-10-CM | POA: Diagnosis not present

## 2017-06-03 DIAGNOSIS — E119 Type 2 diabetes mellitus without complications: Secondary | ICD-10-CM | POA: Diagnosis not present

## 2017-06-03 DIAGNOSIS — E038 Other specified hypothyroidism: Secondary | ICD-10-CM | POA: Diagnosis not present

## 2017-06-03 DIAGNOSIS — E559 Vitamin D deficiency, unspecified: Secondary | ICD-10-CM | POA: Diagnosis not present

## 2017-06-04 DIAGNOSIS — F79 Unspecified intellectual disabilities: Secondary | ICD-10-CM | POA: Diagnosis not present

## 2017-06-05 DIAGNOSIS — F79 Unspecified intellectual disabilities: Secondary | ICD-10-CM | POA: Diagnosis not present

## 2017-06-06 DIAGNOSIS — F79 Unspecified intellectual disabilities: Secondary | ICD-10-CM | POA: Diagnosis not present

## 2017-06-07 DIAGNOSIS — F79 Unspecified intellectual disabilities: Secondary | ICD-10-CM | POA: Diagnosis not present

## 2017-06-08 DIAGNOSIS — F79 Unspecified intellectual disabilities: Secondary | ICD-10-CM | POA: Diagnosis not present

## 2017-06-09 DIAGNOSIS — F79 Unspecified intellectual disabilities: Secondary | ICD-10-CM | POA: Diagnosis not present

## 2017-06-10 DIAGNOSIS — F79 Unspecified intellectual disabilities: Secondary | ICD-10-CM | POA: Diagnosis not present

## 2017-06-11 DIAGNOSIS — F79 Unspecified intellectual disabilities: Secondary | ICD-10-CM | POA: Diagnosis not present

## 2017-06-12 DIAGNOSIS — F79 Unspecified intellectual disabilities: Secondary | ICD-10-CM | POA: Diagnosis not present

## 2017-06-13 DIAGNOSIS — F79 Unspecified intellectual disabilities: Secondary | ICD-10-CM | POA: Diagnosis not present

## 2017-06-14 DIAGNOSIS — F79 Unspecified intellectual disabilities: Secondary | ICD-10-CM | POA: Diagnosis not present

## 2017-06-15 DIAGNOSIS — F79 Unspecified intellectual disabilities: Secondary | ICD-10-CM | POA: Diagnosis not present

## 2017-06-16 DIAGNOSIS — F79 Unspecified intellectual disabilities: Secondary | ICD-10-CM | POA: Diagnosis not present

## 2017-06-17 DIAGNOSIS — F79 Unspecified intellectual disabilities: Secondary | ICD-10-CM | POA: Diagnosis not present

## 2017-06-18 DIAGNOSIS — F79 Unspecified intellectual disabilities: Secondary | ICD-10-CM | POA: Diagnosis not present

## 2017-06-19 DIAGNOSIS — F79 Unspecified intellectual disabilities: Secondary | ICD-10-CM | POA: Diagnosis not present

## 2017-06-20 DIAGNOSIS — F79 Unspecified intellectual disabilities: Secondary | ICD-10-CM | POA: Diagnosis not present

## 2017-06-21 DIAGNOSIS — F79 Unspecified intellectual disabilities: Secondary | ICD-10-CM | POA: Diagnosis not present

## 2017-06-22 DIAGNOSIS — F79 Unspecified intellectual disabilities: Secondary | ICD-10-CM | POA: Diagnosis not present

## 2017-06-23 DIAGNOSIS — F79 Unspecified intellectual disabilities: Secondary | ICD-10-CM | POA: Diagnosis not present

## 2017-06-24 DIAGNOSIS — F79 Unspecified intellectual disabilities: Secondary | ICD-10-CM | POA: Diagnosis not present

## 2017-06-25 DIAGNOSIS — F79 Unspecified intellectual disabilities: Secondary | ICD-10-CM | POA: Diagnosis not present

## 2017-06-26 DIAGNOSIS — F79 Unspecified intellectual disabilities: Secondary | ICD-10-CM | POA: Diagnosis not present

## 2017-06-27 DIAGNOSIS — F79 Unspecified intellectual disabilities: Secondary | ICD-10-CM | POA: Diagnosis not present

## 2017-06-28 DIAGNOSIS — F79 Unspecified intellectual disabilities: Secondary | ICD-10-CM | POA: Diagnosis not present

## 2017-06-29 DIAGNOSIS — F79 Unspecified intellectual disabilities: Secondary | ICD-10-CM | POA: Diagnosis not present

## 2017-06-30 DIAGNOSIS — F79 Unspecified intellectual disabilities: Secondary | ICD-10-CM | POA: Diagnosis not present

## 2017-07-01 DIAGNOSIS — F79 Unspecified intellectual disabilities: Secondary | ICD-10-CM | POA: Diagnosis not present

## 2017-07-02 DIAGNOSIS — F79 Unspecified intellectual disabilities: Secondary | ICD-10-CM | POA: Diagnosis not present

## 2017-07-03 DIAGNOSIS — F79 Unspecified intellectual disabilities: Secondary | ICD-10-CM | POA: Diagnosis not present

## 2017-07-04 DIAGNOSIS — F79 Unspecified intellectual disabilities: Secondary | ICD-10-CM | POA: Diagnosis not present

## 2017-07-05 DIAGNOSIS — F79 Unspecified intellectual disabilities: Secondary | ICD-10-CM | POA: Diagnosis not present

## 2017-07-06 DIAGNOSIS — F79 Unspecified intellectual disabilities: Secondary | ICD-10-CM | POA: Diagnosis not present

## 2017-07-07 DIAGNOSIS — F79 Unspecified intellectual disabilities: Secondary | ICD-10-CM | POA: Diagnosis not present

## 2017-07-08 DIAGNOSIS — E039 Hypothyroidism, unspecified: Secondary | ICD-10-CM | POA: Diagnosis not present

## 2017-07-08 DIAGNOSIS — I10 Essential (primary) hypertension: Secondary | ICD-10-CM | POA: Diagnosis not present

## 2017-07-08 DIAGNOSIS — R32 Unspecified urinary incontinence: Secondary | ICD-10-CM | POA: Diagnosis not present

## 2017-07-08 DIAGNOSIS — F79 Unspecified intellectual disabilities: Secondary | ICD-10-CM | POA: Diagnosis not present

## 2017-07-08 DIAGNOSIS — M545 Low back pain: Secondary | ICD-10-CM | POA: Diagnosis not present

## 2017-07-09 DIAGNOSIS — M545 Low back pain: Secondary | ICD-10-CM | POA: Diagnosis not present

## 2017-07-09 DIAGNOSIS — F79 Unspecified intellectual disabilities: Secondary | ICD-10-CM | POA: Diagnosis not present

## 2017-07-09 DIAGNOSIS — N390 Urinary tract infection, site not specified: Secondary | ICD-10-CM | POA: Diagnosis not present

## 2017-07-10 DIAGNOSIS — F79 Unspecified intellectual disabilities: Secondary | ICD-10-CM | POA: Diagnosis not present

## 2017-07-10 DIAGNOSIS — M545 Low back pain: Secondary | ICD-10-CM | POA: Diagnosis not present

## 2017-07-11 DIAGNOSIS — F79 Unspecified intellectual disabilities: Secondary | ICD-10-CM | POA: Diagnosis not present

## 2017-07-12 DIAGNOSIS — F79 Unspecified intellectual disabilities: Secondary | ICD-10-CM | POA: Diagnosis not present

## 2017-07-13 DIAGNOSIS — F79 Unspecified intellectual disabilities: Secondary | ICD-10-CM | POA: Diagnosis not present

## 2017-07-14 DIAGNOSIS — F79 Unspecified intellectual disabilities: Secondary | ICD-10-CM | POA: Diagnosis not present

## 2017-07-15 DIAGNOSIS — F79 Unspecified intellectual disabilities: Secondary | ICD-10-CM | POA: Diagnosis not present

## 2017-07-16 DIAGNOSIS — F79 Unspecified intellectual disabilities: Secondary | ICD-10-CM | POA: Diagnosis not present

## 2017-07-17 DIAGNOSIS — F79 Unspecified intellectual disabilities: Secondary | ICD-10-CM | POA: Diagnosis not present

## 2017-07-18 DIAGNOSIS — F79 Unspecified intellectual disabilities: Secondary | ICD-10-CM | POA: Diagnosis not present

## 2017-07-19 DIAGNOSIS — F79 Unspecified intellectual disabilities: Secondary | ICD-10-CM | POA: Diagnosis not present

## 2017-07-20 DIAGNOSIS — F79 Unspecified intellectual disabilities: Secondary | ICD-10-CM | POA: Diagnosis not present

## 2017-07-21 DIAGNOSIS — F79 Unspecified intellectual disabilities: Secondary | ICD-10-CM | POA: Diagnosis not present

## 2017-07-22 DIAGNOSIS — F79 Unspecified intellectual disabilities: Secondary | ICD-10-CM | POA: Diagnosis not present

## 2017-07-23 DIAGNOSIS — F79 Unspecified intellectual disabilities: Secondary | ICD-10-CM | POA: Diagnosis not present

## 2017-07-24 DIAGNOSIS — F79 Unspecified intellectual disabilities: Secondary | ICD-10-CM | POA: Diagnosis not present

## 2017-07-25 DIAGNOSIS — F79 Unspecified intellectual disabilities: Secondary | ICD-10-CM | POA: Diagnosis not present

## 2017-07-26 DIAGNOSIS — F79 Unspecified intellectual disabilities: Secondary | ICD-10-CM | POA: Diagnosis not present

## 2017-07-27 DIAGNOSIS — F79 Unspecified intellectual disabilities: Secondary | ICD-10-CM | POA: Diagnosis not present

## 2017-07-28 DIAGNOSIS — F79 Unspecified intellectual disabilities: Secondary | ICD-10-CM | POA: Diagnosis not present

## 2017-07-29 DIAGNOSIS — F79 Unspecified intellectual disabilities: Secondary | ICD-10-CM | POA: Diagnosis not present

## 2017-07-30 DIAGNOSIS — F79 Unspecified intellectual disabilities: Secondary | ICD-10-CM | POA: Diagnosis not present

## 2017-07-31 DIAGNOSIS — F79 Unspecified intellectual disabilities: Secondary | ICD-10-CM | POA: Diagnosis not present

## 2017-08-01 DIAGNOSIS — F79 Unspecified intellectual disabilities: Secondary | ICD-10-CM | POA: Diagnosis not present

## 2017-08-02 DIAGNOSIS — R32 Unspecified urinary incontinence: Secondary | ICD-10-CM | POA: Diagnosis not present

## 2017-08-02 DIAGNOSIS — F79 Unspecified intellectual disabilities: Secondary | ICD-10-CM | POA: Diagnosis not present

## 2017-08-03 DIAGNOSIS — F79 Unspecified intellectual disabilities: Secondary | ICD-10-CM | POA: Diagnosis not present

## 2017-08-04 DIAGNOSIS — F79 Unspecified intellectual disabilities: Secondary | ICD-10-CM | POA: Diagnosis not present

## 2017-08-05 DIAGNOSIS — F79 Unspecified intellectual disabilities: Secondary | ICD-10-CM | POA: Diagnosis not present

## 2017-08-06 DIAGNOSIS — F79 Unspecified intellectual disabilities: Secondary | ICD-10-CM | POA: Diagnosis not present

## 2017-08-07 DIAGNOSIS — F79 Unspecified intellectual disabilities: Secondary | ICD-10-CM | POA: Diagnosis not present

## 2017-08-08 DIAGNOSIS — F79 Unspecified intellectual disabilities: Secondary | ICD-10-CM | POA: Diagnosis not present

## 2017-08-09 DIAGNOSIS — F79 Unspecified intellectual disabilities: Secondary | ICD-10-CM | POA: Diagnosis not present

## 2017-08-10 DIAGNOSIS — F79 Unspecified intellectual disabilities: Secondary | ICD-10-CM | POA: Diagnosis not present

## 2017-08-11 DIAGNOSIS — F79 Unspecified intellectual disabilities: Secondary | ICD-10-CM | POA: Diagnosis not present

## 2017-08-12 DIAGNOSIS — I10 Essential (primary) hypertension: Secondary | ICD-10-CM | POA: Diagnosis not present

## 2017-08-12 DIAGNOSIS — I509 Heart failure, unspecified: Secondary | ICD-10-CM | POA: Diagnosis not present

## 2017-08-12 DIAGNOSIS — R32 Unspecified urinary incontinence: Secondary | ICD-10-CM | POA: Diagnosis not present

## 2017-08-12 DIAGNOSIS — F79 Unspecified intellectual disabilities: Secondary | ICD-10-CM | POA: Diagnosis not present

## 2017-08-13 DIAGNOSIS — F79 Unspecified intellectual disabilities: Secondary | ICD-10-CM | POA: Diagnosis not present

## 2017-08-14 DIAGNOSIS — F79 Unspecified intellectual disabilities: Secondary | ICD-10-CM | POA: Diagnosis not present

## 2017-08-15 DIAGNOSIS — F79 Unspecified intellectual disabilities: Secondary | ICD-10-CM | POA: Diagnosis not present

## 2017-08-16 DIAGNOSIS — F79 Unspecified intellectual disabilities: Secondary | ICD-10-CM | POA: Diagnosis not present

## 2017-08-17 DIAGNOSIS — F79 Unspecified intellectual disabilities: Secondary | ICD-10-CM | POA: Diagnosis not present

## 2017-08-18 DIAGNOSIS — F79 Unspecified intellectual disabilities: Secondary | ICD-10-CM | POA: Diagnosis not present

## 2017-08-19 DIAGNOSIS — F79 Unspecified intellectual disabilities: Secondary | ICD-10-CM | POA: Diagnosis not present

## 2017-08-20 DIAGNOSIS — F79 Unspecified intellectual disabilities: Secondary | ICD-10-CM | POA: Diagnosis not present

## 2017-08-21 DIAGNOSIS — F79 Unspecified intellectual disabilities: Secondary | ICD-10-CM | POA: Diagnosis not present

## 2017-08-22 DIAGNOSIS — F79 Unspecified intellectual disabilities: Secondary | ICD-10-CM | POA: Diagnosis not present

## 2017-08-23 DIAGNOSIS — F79 Unspecified intellectual disabilities: Secondary | ICD-10-CM | POA: Diagnosis not present

## 2017-08-24 DIAGNOSIS — F79 Unspecified intellectual disabilities: Secondary | ICD-10-CM | POA: Diagnosis not present

## 2017-08-25 DIAGNOSIS — F79 Unspecified intellectual disabilities: Secondary | ICD-10-CM | POA: Diagnosis not present

## 2017-08-26 DIAGNOSIS — F79 Unspecified intellectual disabilities: Secondary | ICD-10-CM | POA: Diagnosis not present

## 2017-08-27 DIAGNOSIS — F79 Unspecified intellectual disabilities: Secondary | ICD-10-CM | POA: Diagnosis not present

## 2017-08-28 DIAGNOSIS — F79 Unspecified intellectual disabilities: Secondary | ICD-10-CM | POA: Diagnosis not present

## 2017-08-29 DIAGNOSIS — F79 Unspecified intellectual disabilities: Secondary | ICD-10-CM | POA: Diagnosis not present

## 2017-08-30 DIAGNOSIS — F79 Unspecified intellectual disabilities: Secondary | ICD-10-CM | POA: Diagnosis not present

## 2017-08-31 DIAGNOSIS — R32 Unspecified urinary incontinence: Secondary | ICD-10-CM | POA: Diagnosis not present

## 2017-08-31 DIAGNOSIS — F79 Unspecified intellectual disabilities: Secondary | ICD-10-CM | POA: Diagnosis not present

## 2017-09-01 DIAGNOSIS — F79 Unspecified intellectual disabilities: Secondary | ICD-10-CM | POA: Diagnosis not present

## 2017-09-02 DIAGNOSIS — F79 Unspecified intellectual disabilities: Secondary | ICD-10-CM | POA: Diagnosis not present

## 2017-09-03 DIAGNOSIS — F79 Unspecified intellectual disabilities: Secondary | ICD-10-CM | POA: Diagnosis not present

## 2017-09-04 DIAGNOSIS — F79 Unspecified intellectual disabilities: Secondary | ICD-10-CM | POA: Diagnosis not present

## 2017-09-05 DIAGNOSIS — F79 Unspecified intellectual disabilities: Secondary | ICD-10-CM | POA: Diagnosis not present

## 2017-09-06 DIAGNOSIS — F79 Unspecified intellectual disabilities: Secondary | ICD-10-CM | POA: Diagnosis not present

## 2017-09-07 DIAGNOSIS — F79 Unspecified intellectual disabilities: Secondary | ICD-10-CM | POA: Diagnosis not present

## 2017-09-08 DIAGNOSIS — F79 Unspecified intellectual disabilities: Secondary | ICD-10-CM | POA: Diagnosis not present

## 2017-09-09 DIAGNOSIS — E038 Other specified hypothyroidism: Secondary | ICD-10-CM | POA: Diagnosis not present

## 2017-09-09 DIAGNOSIS — E559 Vitamin D deficiency, unspecified: Secondary | ICD-10-CM | POA: Diagnosis not present

## 2017-09-09 DIAGNOSIS — F79 Unspecified intellectual disabilities: Secondary | ICD-10-CM | POA: Diagnosis not present

## 2017-09-09 DIAGNOSIS — Z79899 Other long term (current) drug therapy: Secondary | ICD-10-CM | POA: Diagnosis not present

## 2017-09-09 DIAGNOSIS — E7849 Other hyperlipidemia: Secondary | ICD-10-CM | POA: Diagnosis not present

## 2017-09-09 DIAGNOSIS — D518 Other vitamin B12 deficiency anemias: Secondary | ICD-10-CM | POA: Diagnosis not present

## 2017-09-09 DIAGNOSIS — E119 Type 2 diabetes mellitus without complications: Secondary | ICD-10-CM | POA: Diagnosis not present

## 2017-09-10 DIAGNOSIS — F79 Unspecified intellectual disabilities: Secondary | ICD-10-CM | POA: Diagnosis not present

## 2017-09-10 DIAGNOSIS — I10 Essential (primary) hypertension: Secondary | ICD-10-CM | POA: Diagnosis not present

## 2017-09-10 DIAGNOSIS — I509 Heart failure, unspecified: Secondary | ICD-10-CM | POA: Diagnosis not present

## 2017-09-10 DIAGNOSIS — M150 Primary generalized (osteo)arthritis: Secondary | ICD-10-CM | POA: Diagnosis not present

## 2017-09-11 DIAGNOSIS — F79 Unspecified intellectual disabilities: Secondary | ICD-10-CM | POA: Diagnosis not present

## 2017-09-12 DIAGNOSIS — F79 Unspecified intellectual disabilities: Secondary | ICD-10-CM | POA: Diagnosis not present

## 2017-09-13 DIAGNOSIS — F79 Unspecified intellectual disabilities: Secondary | ICD-10-CM | POA: Diagnosis not present

## 2017-09-14 DIAGNOSIS — F79 Unspecified intellectual disabilities: Secondary | ICD-10-CM | POA: Diagnosis not present

## 2017-09-15 DIAGNOSIS — F79 Unspecified intellectual disabilities: Secondary | ICD-10-CM | POA: Diagnosis not present

## 2017-09-16 DIAGNOSIS — F79 Unspecified intellectual disabilities: Secondary | ICD-10-CM | POA: Diagnosis not present

## 2017-09-17 DIAGNOSIS — F79 Unspecified intellectual disabilities: Secondary | ICD-10-CM | POA: Diagnosis not present

## 2017-09-18 DIAGNOSIS — F79 Unspecified intellectual disabilities: Secondary | ICD-10-CM | POA: Diagnosis not present

## 2017-09-19 DIAGNOSIS — F79 Unspecified intellectual disabilities: Secondary | ICD-10-CM | POA: Diagnosis not present

## 2017-09-20 DIAGNOSIS — F79 Unspecified intellectual disabilities: Secondary | ICD-10-CM | POA: Diagnosis not present

## 2017-09-21 DIAGNOSIS — F79 Unspecified intellectual disabilities: Secondary | ICD-10-CM | POA: Diagnosis not present

## 2017-09-21 DIAGNOSIS — R011 Cardiac murmur, unspecified: Secondary | ICD-10-CM | POA: Diagnosis not present

## 2017-09-22 DIAGNOSIS — F79 Unspecified intellectual disabilities: Secondary | ICD-10-CM | POA: Diagnosis not present

## 2017-09-23 DIAGNOSIS — F79 Unspecified intellectual disabilities: Secondary | ICD-10-CM | POA: Diagnosis not present

## 2017-09-24 DIAGNOSIS — F79 Unspecified intellectual disabilities: Secondary | ICD-10-CM | POA: Diagnosis not present

## 2017-09-25 DIAGNOSIS — F79 Unspecified intellectual disabilities: Secondary | ICD-10-CM | POA: Diagnosis not present

## 2017-09-26 DIAGNOSIS — F79 Unspecified intellectual disabilities: Secondary | ICD-10-CM | POA: Diagnosis not present

## 2017-09-27 DIAGNOSIS — F79 Unspecified intellectual disabilities: Secondary | ICD-10-CM | POA: Diagnosis not present

## 2017-09-28 DIAGNOSIS — F79 Unspecified intellectual disabilities: Secondary | ICD-10-CM | POA: Diagnosis not present

## 2017-09-29 DIAGNOSIS — F79 Unspecified intellectual disabilities: Secondary | ICD-10-CM | POA: Diagnosis not present

## 2017-09-30 DIAGNOSIS — F79 Unspecified intellectual disabilities: Secondary | ICD-10-CM | POA: Diagnosis not present

## 2017-10-01 DIAGNOSIS — F79 Unspecified intellectual disabilities: Secondary | ICD-10-CM | POA: Diagnosis not present

## 2017-10-02 DIAGNOSIS — F79 Unspecified intellectual disabilities: Secondary | ICD-10-CM | POA: Diagnosis not present

## 2017-10-03 DIAGNOSIS — F79 Unspecified intellectual disabilities: Secondary | ICD-10-CM | POA: Diagnosis not present

## 2017-10-04 DIAGNOSIS — F79 Unspecified intellectual disabilities: Secondary | ICD-10-CM | POA: Diagnosis not present

## 2017-10-06 DIAGNOSIS — F79 Unspecified intellectual disabilities: Secondary | ICD-10-CM | POA: Diagnosis not present

## 2017-10-06 DIAGNOSIS — R32 Unspecified urinary incontinence: Secondary | ICD-10-CM | POA: Diagnosis not present

## 2017-10-07 DIAGNOSIS — I10 Essential (primary) hypertension: Secondary | ICD-10-CM | POA: Diagnosis not present

## 2017-10-07 DIAGNOSIS — I509 Heart failure, unspecified: Secondary | ICD-10-CM | POA: Diagnosis not present

## 2017-10-07 DIAGNOSIS — E039 Hypothyroidism, unspecified: Secondary | ICD-10-CM | POA: Diagnosis not present

## 2017-10-07 DIAGNOSIS — F79 Unspecified intellectual disabilities: Secondary | ICD-10-CM | POA: Diagnosis not present

## 2017-10-08 DIAGNOSIS — F79 Unspecified intellectual disabilities: Secondary | ICD-10-CM | POA: Diagnosis not present

## 2017-10-09 DIAGNOSIS — F79 Unspecified intellectual disabilities: Secondary | ICD-10-CM | POA: Diagnosis not present

## 2017-10-10 DIAGNOSIS — F79 Unspecified intellectual disabilities: Secondary | ICD-10-CM | POA: Diagnosis not present

## 2017-10-11 DIAGNOSIS — F79 Unspecified intellectual disabilities: Secondary | ICD-10-CM | POA: Diagnosis not present

## 2017-10-12 DIAGNOSIS — F79 Unspecified intellectual disabilities: Secondary | ICD-10-CM | POA: Diagnosis not present

## 2017-10-13 DIAGNOSIS — F79 Unspecified intellectual disabilities: Secondary | ICD-10-CM | POA: Diagnosis not present

## 2017-10-14 DIAGNOSIS — F79 Unspecified intellectual disabilities: Secondary | ICD-10-CM | POA: Diagnosis not present

## 2017-10-15 DIAGNOSIS — F79 Unspecified intellectual disabilities: Secondary | ICD-10-CM | POA: Diagnosis not present

## 2017-10-16 DIAGNOSIS — F79 Unspecified intellectual disabilities: Secondary | ICD-10-CM | POA: Diagnosis not present

## 2017-10-17 DIAGNOSIS — F79 Unspecified intellectual disabilities: Secondary | ICD-10-CM | POA: Diagnosis not present

## 2017-10-18 DIAGNOSIS — F79 Unspecified intellectual disabilities: Secondary | ICD-10-CM | POA: Diagnosis not present

## 2017-10-19 DIAGNOSIS — F79 Unspecified intellectual disabilities: Secondary | ICD-10-CM | POA: Diagnosis not present

## 2017-10-20 DIAGNOSIS — F79 Unspecified intellectual disabilities: Secondary | ICD-10-CM | POA: Diagnosis not present

## 2017-10-21 DIAGNOSIS — F79 Unspecified intellectual disabilities: Secondary | ICD-10-CM | POA: Diagnosis not present

## 2017-10-22 DIAGNOSIS — F79 Unspecified intellectual disabilities: Secondary | ICD-10-CM | POA: Diagnosis not present

## 2017-10-23 DIAGNOSIS — E038 Other specified hypothyroidism: Secondary | ICD-10-CM | POA: Diagnosis not present

## 2017-10-23 DIAGNOSIS — F79 Unspecified intellectual disabilities: Secondary | ICD-10-CM | POA: Diagnosis not present

## 2017-10-23 DIAGNOSIS — D518 Other vitamin B12 deficiency anemias: Secondary | ICD-10-CM | POA: Diagnosis not present

## 2017-10-23 DIAGNOSIS — E119 Type 2 diabetes mellitus without complications: Secondary | ICD-10-CM | POA: Diagnosis not present

## 2017-10-24 DIAGNOSIS — F79 Unspecified intellectual disabilities: Secondary | ICD-10-CM | POA: Diagnosis not present

## 2017-10-25 DIAGNOSIS — F79 Unspecified intellectual disabilities: Secondary | ICD-10-CM | POA: Diagnosis not present

## 2017-10-26 DIAGNOSIS — F79 Unspecified intellectual disabilities: Secondary | ICD-10-CM | POA: Diagnosis not present

## 2017-10-27 DIAGNOSIS — F79 Unspecified intellectual disabilities: Secondary | ICD-10-CM | POA: Diagnosis not present

## 2017-10-28 DIAGNOSIS — F79 Unspecified intellectual disabilities: Secondary | ICD-10-CM | POA: Diagnosis not present

## 2017-10-29 DIAGNOSIS — F79 Unspecified intellectual disabilities: Secondary | ICD-10-CM | POA: Diagnosis not present

## 2017-10-30 DIAGNOSIS — F79 Unspecified intellectual disabilities: Secondary | ICD-10-CM | POA: Diagnosis not present

## 2017-10-31 DIAGNOSIS — F79 Unspecified intellectual disabilities: Secondary | ICD-10-CM | POA: Diagnosis not present

## 2017-11-01 DIAGNOSIS — F79 Unspecified intellectual disabilities: Secondary | ICD-10-CM | POA: Diagnosis not present

## 2017-11-02 DIAGNOSIS — F79 Unspecified intellectual disabilities: Secondary | ICD-10-CM | POA: Diagnosis not present

## 2017-11-02 DIAGNOSIS — R32 Unspecified urinary incontinence: Secondary | ICD-10-CM | POA: Diagnosis not present

## 2017-11-03 DIAGNOSIS — F79 Unspecified intellectual disabilities: Secondary | ICD-10-CM | POA: Diagnosis not present

## 2017-11-04 DIAGNOSIS — F79 Unspecified intellectual disabilities: Secondary | ICD-10-CM | POA: Diagnosis not present

## 2017-11-05 DIAGNOSIS — F79 Unspecified intellectual disabilities: Secondary | ICD-10-CM | POA: Diagnosis not present

## 2017-11-07 DIAGNOSIS — F79 Unspecified intellectual disabilities: Secondary | ICD-10-CM | POA: Diagnosis not present

## 2017-11-08 DIAGNOSIS — F79 Unspecified intellectual disabilities: Secondary | ICD-10-CM | POA: Diagnosis not present

## 2017-11-09 DIAGNOSIS — F79 Unspecified intellectual disabilities: Secondary | ICD-10-CM | POA: Diagnosis not present

## 2017-11-10 DIAGNOSIS — F79 Unspecified intellectual disabilities: Secondary | ICD-10-CM | POA: Diagnosis not present

## 2017-11-11 DIAGNOSIS — F79 Unspecified intellectual disabilities: Secondary | ICD-10-CM | POA: Diagnosis not present

## 2017-11-12 DIAGNOSIS — F79 Unspecified intellectual disabilities: Secondary | ICD-10-CM | POA: Diagnosis not present

## 2017-11-13 DIAGNOSIS — F79 Unspecified intellectual disabilities: Secondary | ICD-10-CM | POA: Diagnosis not present

## 2017-11-14 DIAGNOSIS — F79 Unspecified intellectual disabilities: Secondary | ICD-10-CM | POA: Diagnosis not present

## 2017-11-15 DIAGNOSIS — F79 Unspecified intellectual disabilities: Secondary | ICD-10-CM | POA: Diagnosis not present

## 2017-11-16 DIAGNOSIS — F79 Unspecified intellectual disabilities: Secondary | ICD-10-CM | POA: Diagnosis not present

## 2017-11-17 DIAGNOSIS — F79 Unspecified intellectual disabilities: Secondary | ICD-10-CM | POA: Diagnosis not present

## 2017-11-18 DIAGNOSIS — F79 Unspecified intellectual disabilities: Secondary | ICD-10-CM | POA: Diagnosis not present

## 2017-11-19 DIAGNOSIS — F79 Unspecified intellectual disabilities: Secondary | ICD-10-CM | POA: Diagnosis not present

## 2017-11-20 DIAGNOSIS — F79 Unspecified intellectual disabilities: Secondary | ICD-10-CM | POA: Diagnosis not present

## 2017-11-21 DIAGNOSIS — F79 Unspecified intellectual disabilities: Secondary | ICD-10-CM | POA: Diagnosis not present

## 2017-11-22 DIAGNOSIS — F79 Unspecified intellectual disabilities: Secondary | ICD-10-CM | POA: Diagnosis not present

## 2017-11-23 DIAGNOSIS — F79 Unspecified intellectual disabilities: Secondary | ICD-10-CM | POA: Diagnosis not present

## 2017-11-24 DIAGNOSIS — F79 Unspecified intellectual disabilities: Secondary | ICD-10-CM | POA: Diagnosis not present

## 2017-11-25 DIAGNOSIS — E119 Type 2 diabetes mellitus without complications: Secondary | ICD-10-CM | POA: Diagnosis not present

## 2017-11-25 DIAGNOSIS — E038 Other specified hypothyroidism: Secondary | ICD-10-CM | POA: Diagnosis not present

## 2017-11-25 DIAGNOSIS — Z79899 Other long term (current) drug therapy: Secondary | ICD-10-CM | POA: Diagnosis not present

## 2017-11-25 DIAGNOSIS — E559 Vitamin D deficiency, unspecified: Secondary | ICD-10-CM | POA: Diagnosis not present

## 2017-11-25 DIAGNOSIS — E7849 Other hyperlipidemia: Secondary | ICD-10-CM | POA: Diagnosis not present

## 2017-11-25 DIAGNOSIS — D518 Other vitamin B12 deficiency anemias: Secondary | ICD-10-CM | POA: Diagnosis not present

## 2017-11-25 DIAGNOSIS — F79 Unspecified intellectual disabilities: Secondary | ICD-10-CM | POA: Diagnosis not present

## 2017-11-26 DIAGNOSIS — F79 Unspecified intellectual disabilities: Secondary | ICD-10-CM | POA: Diagnosis not present

## 2017-11-27 DIAGNOSIS — F79 Unspecified intellectual disabilities: Secondary | ICD-10-CM | POA: Diagnosis not present

## 2017-11-28 DIAGNOSIS — F79 Unspecified intellectual disabilities: Secondary | ICD-10-CM | POA: Diagnosis not present

## 2017-11-29 DIAGNOSIS — F79 Unspecified intellectual disabilities: Secondary | ICD-10-CM | POA: Diagnosis not present

## 2017-11-30 DIAGNOSIS — F79 Unspecified intellectual disabilities: Secondary | ICD-10-CM | POA: Diagnosis not present

## 2017-12-01 DIAGNOSIS — F79 Unspecified intellectual disabilities: Secondary | ICD-10-CM | POA: Diagnosis not present

## 2017-12-01 DIAGNOSIS — R32 Unspecified urinary incontinence: Secondary | ICD-10-CM | POA: Diagnosis not present

## 2017-12-02 DIAGNOSIS — F79 Unspecified intellectual disabilities: Secondary | ICD-10-CM | POA: Diagnosis not present

## 2017-12-02 DIAGNOSIS — I509 Heart failure, unspecified: Secondary | ICD-10-CM | POA: Diagnosis not present

## 2017-12-02 DIAGNOSIS — I10 Essential (primary) hypertension: Secondary | ICD-10-CM | POA: Diagnosis not present

## 2017-12-03 DIAGNOSIS — F79 Unspecified intellectual disabilities: Secondary | ICD-10-CM | POA: Diagnosis not present

## 2017-12-04 DIAGNOSIS — F79 Unspecified intellectual disabilities: Secondary | ICD-10-CM | POA: Diagnosis not present

## 2017-12-05 DIAGNOSIS — F79 Unspecified intellectual disabilities: Secondary | ICD-10-CM | POA: Diagnosis not present

## 2017-12-06 DIAGNOSIS — F79 Unspecified intellectual disabilities: Secondary | ICD-10-CM | POA: Diagnosis not present

## 2017-12-07 DIAGNOSIS — F79 Unspecified intellectual disabilities: Secondary | ICD-10-CM | POA: Diagnosis not present

## 2017-12-08 DIAGNOSIS — F79 Unspecified intellectual disabilities: Secondary | ICD-10-CM | POA: Diagnosis not present

## 2017-12-09 DIAGNOSIS — F79 Unspecified intellectual disabilities: Secondary | ICD-10-CM | POA: Diagnosis not present

## 2017-12-10 DIAGNOSIS — F79 Unspecified intellectual disabilities: Secondary | ICD-10-CM | POA: Diagnosis not present

## 2017-12-11 DIAGNOSIS — F79 Unspecified intellectual disabilities: Secondary | ICD-10-CM | POA: Diagnosis not present

## 2017-12-12 DIAGNOSIS — F79 Unspecified intellectual disabilities: Secondary | ICD-10-CM | POA: Diagnosis not present

## 2017-12-13 DIAGNOSIS — F79 Unspecified intellectual disabilities: Secondary | ICD-10-CM | POA: Diagnosis not present

## 2017-12-14 DIAGNOSIS — F79 Unspecified intellectual disabilities: Secondary | ICD-10-CM | POA: Diagnosis not present

## 2017-12-15 DIAGNOSIS — F79 Unspecified intellectual disabilities: Secondary | ICD-10-CM | POA: Diagnosis not present

## 2017-12-16 DIAGNOSIS — F79 Unspecified intellectual disabilities: Secondary | ICD-10-CM | POA: Diagnosis not present

## 2017-12-17 DIAGNOSIS — F79 Unspecified intellectual disabilities: Secondary | ICD-10-CM | POA: Diagnosis not present

## 2017-12-18 DIAGNOSIS — F79 Unspecified intellectual disabilities: Secondary | ICD-10-CM | POA: Diagnosis not present

## 2017-12-19 DIAGNOSIS — F79 Unspecified intellectual disabilities: Secondary | ICD-10-CM | POA: Diagnosis not present

## 2017-12-20 DIAGNOSIS — F79 Unspecified intellectual disabilities: Secondary | ICD-10-CM | POA: Diagnosis not present

## 2017-12-21 DIAGNOSIS — F79 Unspecified intellectual disabilities: Secondary | ICD-10-CM | POA: Diagnosis not present

## 2017-12-22 DIAGNOSIS — F79 Unspecified intellectual disabilities: Secondary | ICD-10-CM | POA: Diagnosis not present

## 2017-12-23 DIAGNOSIS — F79 Unspecified intellectual disabilities: Secondary | ICD-10-CM | POA: Diagnosis not present

## 2017-12-24 DIAGNOSIS — F79 Unspecified intellectual disabilities: Secondary | ICD-10-CM | POA: Diagnosis not present

## 2017-12-25 DIAGNOSIS — F79 Unspecified intellectual disabilities: Secondary | ICD-10-CM | POA: Diagnosis not present

## 2017-12-26 DIAGNOSIS — F79 Unspecified intellectual disabilities: Secondary | ICD-10-CM | POA: Diagnosis not present

## 2017-12-27 DIAGNOSIS — F79 Unspecified intellectual disabilities: Secondary | ICD-10-CM | POA: Diagnosis not present

## 2017-12-28 DIAGNOSIS — F79 Unspecified intellectual disabilities: Secondary | ICD-10-CM | POA: Diagnosis not present

## 2017-12-29 DIAGNOSIS — F79 Unspecified intellectual disabilities: Secondary | ICD-10-CM | POA: Diagnosis not present

## 2017-12-30 DIAGNOSIS — D518 Other vitamin B12 deficiency anemias: Secondary | ICD-10-CM | POA: Diagnosis not present

## 2017-12-30 DIAGNOSIS — F79 Unspecified intellectual disabilities: Secondary | ICD-10-CM | POA: Diagnosis not present

## 2017-12-30 DIAGNOSIS — E038 Other specified hypothyroidism: Secondary | ICD-10-CM | POA: Diagnosis not present

## 2017-12-30 DIAGNOSIS — I509 Heart failure, unspecified: Secondary | ICD-10-CM | POA: Diagnosis not present

## 2017-12-30 DIAGNOSIS — I10 Essential (primary) hypertension: Secondary | ICD-10-CM | POA: Diagnosis not present

## 2017-12-30 DIAGNOSIS — E119 Type 2 diabetes mellitus without complications: Secondary | ICD-10-CM | POA: Diagnosis not present

## 2017-12-31 DIAGNOSIS — F79 Unspecified intellectual disabilities: Secondary | ICD-10-CM | POA: Diagnosis not present

## 2018-01-01 DIAGNOSIS — F79 Unspecified intellectual disabilities: Secondary | ICD-10-CM | POA: Diagnosis not present

## 2018-01-02 DIAGNOSIS — F79 Unspecified intellectual disabilities: Secondary | ICD-10-CM | POA: Diagnosis not present

## 2018-01-03 DIAGNOSIS — F79 Unspecified intellectual disabilities: Secondary | ICD-10-CM | POA: Diagnosis not present

## 2018-01-04 DIAGNOSIS — R32 Unspecified urinary incontinence: Secondary | ICD-10-CM | POA: Diagnosis not present

## 2018-01-04 DIAGNOSIS — F79 Unspecified intellectual disabilities: Secondary | ICD-10-CM | POA: Diagnosis not present

## 2018-01-05 DIAGNOSIS — F79 Unspecified intellectual disabilities: Secondary | ICD-10-CM | POA: Diagnosis not present

## 2018-01-06 DIAGNOSIS — F79 Unspecified intellectual disabilities: Secondary | ICD-10-CM | POA: Diagnosis not present

## 2018-01-07 DIAGNOSIS — F79 Unspecified intellectual disabilities: Secondary | ICD-10-CM | POA: Diagnosis not present

## 2018-01-08 DIAGNOSIS — F79 Unspecified intellectual disabilities: Secondary | ICD-10-CM | POA: Diagnosis not present

## 2018-01-09 DIAGNOSIS — F79 Unspecified intellectual disabilities: Secondary | ICD-10-CM | POA: Diagnosis not present

## 2018-01-10 DIAGNOSIS — F79 Unspecified intellectual disabilities: Secondary | ICD-10-CM | POA: Diagnosis not present

## 2018-01-11 DIAGNOSIS — F79 Unspecified intellectual disabilities: Secondary | ICD-10-CM | POA: Diagnosis not present

## 2018-01-12 DIAGNOSIS — F79 Unspecified intellectual disabilities: Secondary | ICD-10-CM | POA: Diagnosis not present

## 2018-01-13 DIAGNOSIS — F79 Unspecified intellectual disabilities: Secondary | ICD-10-CM | POA: Diagnosis not present

## 2018-01-14 DIAGNOSIS — F79 Unspecified intellectual disabilities: Secondary | ICD-10-CM | POA: Diagnosis not present

## 2018-01-15 DIAGNOSIS — F79 Unspecified intellectual disabilities: Secondary | ICD-10-CM | POA: Diagnosis not present

## 2018-01-16 DIAGNOSIS — F79 Unspecified intellectual disabilities: Secondary | ICD-10-CM | POA: Diagnosis not present

## 2018-01-17 DIAGNOSIS — F79 Unspecified intellectual disabilities: Secondary | ICD-10-CM | POA: Diagnosis not present

## 2018-01-18 DIAGNOSIS — F79 Unspecified intellectual disabilities: Secondary | ICD-10-CM | POA: Diagnosis not present

## 2018-01-19 DIAGNOSIS — F79 Unspecified intellectual disabilities: Secondary | ICD-10-CM | POA: Diagnosis not present

## 2018-01-20 DIAGNOSIS — F79 Unspecified intellectual disabilities: Secondary | ICD-10-CM | POA: Diagnosis not present

## 2018-01-21 DIAGNOSIS — F79 Unspecified intellectual disabilities: Secondary | ICD-10-CM | POA: Diagnosis not present

## 2018-01-22 DIAGNOSIS — F79 Unspecified intellectual disabilities: Secondary | ICD-10-CM | POA: Diagnosis not present

## 2018-01-23 DIAGNOSIS — F79 Unspecified intellectual disabilities: Secondary | ICD-10-CM | POA: Diagnosis not present

## 2018-01-27 DIAGNOSIS — I10 Essential (primary) hypertension: Secondary | ICD-10-CM | POA: Diagnosis not present

## 2018-01-27 DIAGNOSIS — D1722 Benign lipomatous neoplasm of skin and subcutaneous tissue of left arm: Secondary | ICD-10-CM | POA: Diagnosis not present

## 2018-01-31 DIAGNOSIS — F79 Unspecified intellectual disabilities: Secondary | ICD-10-CM | POA: Diagnosis not present

## 2018-02-01 DIAGNOSIS — F79 Unspecified intellectual disabilities: Secondary | ICD-10-CM | POA: Diagnosis not present

## 2018-02-01 DIAGNOSIS — E038 Other specified hypothyroidism: Secondary | ICD-10-CM | POA: Diagnosis not present

## 2018-02-01 DIAGNOSIS — D518 Other vitamin B12 deficiency anemias: Secondary | ICD-10-CM | POA: Diagnosis not present

## 2018-02-01 DIAGNOSIS — E119 Type 2 diabetes mellitus without complications: Secondary | ICD-10-CM | POA: Diagnosis not present

## 2018-02-02 DIAGNOSIS — F79 Unspecified intellectual disabilities: Secondary | ICD-10-CM | POA: Diagnosis not present

## 2018-02-03 DIAGNOSIS — M7989 Other specified soft tissue disorders: Secondary | ICD-10-CM | POA: Diagnosis not present

## 2018-02-03 DIAGNOSIS — F79 Unspecified intellectual disabilities: Secondary | ICD-10-CM | POA: Diagnosis not present

## 2018-02-04 DIAGNOSIS — F79 Unspecified intellectual disabilities: Secondary | ICD-10-CM | POA: Diagnosis not present

## 2018-02-04 DIAGNOSIS — R32 Unspecified urinary incontinence: Secondary | ICD-10-CM | POA: Diagnosis not present

## 2018-02-05 DIAGNOSIS — F79 Unspecified intellectual disabilities: Secondary | ICD-10-CM | POA: Diagnosis not present

## 2018-02-06 DIAGNOSIS — F79 Unspecified intellectual disabilities: Secondary | ICD-10-CM | POA: Diagnosis not present

## 2018-02-07 DIAGNOSIS — F79 Unspecified intellectual disabilities: Secondary | ICD-10-CM | POA: Diagnosis not present

## 2018-02-08 DIAGNOSIS — F79 Unspecified intellectual disabilities: Secondary | ICD-10-CM | POA: Diagnosis not present

## 2018-02-09 DIAGNOSIS — F79 Unspecified intellectual disabilities: Secondary | ICD-10-CM | POA: Diagnosis not present

## 2018-02-10 DIAGNOSIS — F79 Unspecified intellectual disabilities: Secondary | ICD-10-CM | POA: Diagnosis not present

## 2018-02-11 DIAGNOSIS — F79 Unspecified intellectual disabilities: Secondary | ICD-10-CM | POA: Diagnosis not present

## 2018-02-12 DIAGNOSIS — F79 Unspecified intellectual disabilities: Secondary | ICD-10-CM | POA: Diagnosis not present

## 2018-02-13 DIAGNOSIS — F79 Unspecified intellectual disabilities: Secondary | ICD-10-CM | POA: Diagnosis not present

## 2018-02-14 DIAGNOSIS — F79 Unspecified intellectual disabilities: Secondary | ICD-10-CM | POA: Diagnosis not present

## 2018-02-15 DIAGNOSIS — F79 Unspecified intellectual disabilities: Secondary | ICD-10-CM | POA: Diagnosis not present

## 2018-02-16 DIAGNOSIS — F79 Unspecified intellectual disabilities: Secondary | ICD-10-CM | POA: Diagnosis not present

## 2018-02-17 DIAGNOSIS — F79 Unspecified intellectual disabilities: Secondary | ICD-10-CM | POA: Diagnosis not present

## 2018-02-18 DIAGNOSIS — F79 Unspecified intellectual disabilities: Secondary | ICD-10-CM | POA: Diagnosis not present

## 2018-02-19 DIAGNOSIS — F79 Unspecified intellectual disabilities: Secondary | ICD-10-CM | POA: Diagnosis not present

## 2018-02-20 DIAGNOSIS — F79 Unspecified intellectual disabilities: Secondary | ICD-10-CM | POA: Diagnosis not present

## 2018-02-21 DIAGNOSIS — F79 Unspecified intellectual disabilities: Secondary | ICD-10-CM | POA: Diagnosis not present

## 2018-02-22 DIAGNOSIS — F79 Unspecified intellectual disabilities: Secondary | ICD-10-CM | POA: Diagnosis not present

## 2018-02-23 DIAGNOSIS — F79 Unspecified intellectual disabilities: Secondary | ICD-10-CM | POA: Diagnosis not present

## 2018-02-24 DIAGNOSIS — F79 Unspecified intellectual disabilities: Secondary | ICD-10-CM | POA: Diagnosis not present

## 2018-02-25 DIAGNOSIS — D518 Other vitamin B12 deficiency anemias: Secondary | ICD-10-CM | POA: Diagnosis not present

## 2018-02-25 DIAGNOSIS — E7849 Other hyperlipidemia: Secondary | ICD-10-CM | POA: Diagnosis not present

## 2018-02-25 DIAGNOSIS — E038 Other specified hypothyroidism: Secondary | ICD-10-CM | POA: Diagnosis not present

## 2018-02-25 DIAGNOSIS — F79 Unspecified intellectual disabilities: Secondary | ICD-10-CM | POA: Diagnosis not present

## 2018-02-25 DIAGNOSIS — E119 Type 2 diabetes mellitus without complications: Secondary | ICD-10-CM | POA: Diagnosis not present

## 2018-02-25 DIAGNOSIS — Z79899 Other long term (current) drug therapy: Secondary | ICD-10-CM | POA: Diagnosis not present

## 2018-02-25 DIAGNOSIS — E559 Vitamin D deficiency, unspecified: Secondary | ICD-10-CM | POA: Diagnosis not present

## 2018-02-26 DIAGNOSIS — F79 Unspecified intellectual disabilities: Secondary | ICD-10-CM | POA: Diagnosis not present

## 2018-02-27 DIAGNOSIS — F79 Unspecified intellectual disabilities: Secondary | ICD-10-CM | POA: Diagnosis not present

## 2018-02-28 DIAGNOSIS — F79 Unspecified intellectual disabilities: Secondary | ICD-10-CM | POA: Diagnosis not present

## 2018-03-01 DIAGNOSIS — F79 Unspecified intellectual disabilities: Secondary | ICD-10-CM | POA: Diagnosis not present

## 2018-03-02 DIAGNOSIS — F79 Unspecified intellectual disabilities: Secondary | ICD-10-CM | POA: Diagnosis not present

## 2018-03-03 DIAGNOSIS — F79 Unspecified intellectual disabilities: Secondary | ICD-10-CM | POA: Diagnosis not present

## 2018-03-04 DIAGNOSIS — F79 Unspecified intellectual disabilities: Secondary | ICD-10-CM | POA: Diagnosis not present

## 2018-03-05 DIAGNOSIS — F79 Unspecified intellectual disabilities: Secondary | ICD-10-CM | POA: Diagnosis not present

## 2018-03-05 DIAGNOSIS — R32 Unspecified urinary incontinence: Secondary | ICD-10-CM | POA: Diagnosis not present

## 2018-03-06 DIAGNOSIS — F79 Unspecified intellectual disabilities: Secondary | ICD-10-CM | POA: Diagnosis not present

## 2018-03-07 DIAGNOSIS — F79 Unspecified intellectual disabilities: Secondary | ICD-10-CM | POA: Diagnosis not present

## 2018-03-08 DIAGNOSIS — F79 Unspecified intellectual disabilities: Secondary | ICD-10-CM | POA: Diagnosis not present

## 2018-03-09 DIAGNOSIS — F79 Unspecified intellectual disabilities: Secondary | ICD-10-CM | POA: Diagnosis not present

## 2018-03-10 DIAGNOSIS — D1722 Benign lipomatous neoplasm of skin and subcutaneous tissue of left arm: Secondary | ICD-10-CM | POA: Diagnosis not present

## 2018-03-10 DIAGNOSIS — I10 Essential (primary) hypertension: Secondary | ICD-10-CM | POA: Diagnosis not present

## 2018-03-10 DIAGNOSIS — F79 Unspecified intellectual disabilities: Secondary | ICD-10-CM | POA: Diagnosis not present

## 2018-03-10 DIAGNOSIS — M150 Primary generalized (osteo)arthritis: Secondary | ICD-10-CM | POA: Diagnosis not present

## 2018-03-11 DIAGNOSIS — F79 Unspecified intellectual disabilities: Secondary | ICD-10-CM | POA: Diagnosis not present

## 2018-03-12 DIAGNOSIS — F79 Unspecified intellectual disabilities: Secondary | ICD-10-CM | POA: Diagnosis not present

## 2018-03-13 DIAGNOSIS — F79 Unspecified intellectual disabilities: Secondary | ICD-10-CM | POA: Diagnosis not present

## 2018-03-14 DIAGNOSIS — F79 Unspecified intellectual disabilities: Secondary | ICD-10-CM | POA: Diagnosis not present

## 2018-03-15 DIAGNOSIS — F79 Unspecified intellectual disabilities: Secondary | ICD-10-CM | POA: Diagnosis not present

## 2018-03-16 DIAGNOSIS — F79 Unspecified intellectual disabilities: Secondary | ICD-10-CM | POA: Diagnosis not present

## 2018-03-17 DIAGNOSIS — F79 Unspecified intellectual disabilities: Secondary | ICD-10-CM | POA: Diagnosis not present

## 2018-03-18 DIAGNOSIS — F79 Unspecified intellectual disabilities: Secondary | ICD-10-CM | POA: Diagnosis not present

## 2018-03-19 DIAGNOSIS — F79 Unspecified intellectual disabilities: Secondary | ICD-10-CM | POA: Diagnosis not present

## 2018-03-20 DIAGNOSIS — F79 Unspecified intellectual disabilities: Secondary | ICD-10-CM | POA: Diagnosis not present

## 2018-03-21 DIAGNOSIS — F79 Unspecified intellectual disabilities: Secondary | ICD-10-CM | POA: Diagnosis not present

## 2018-03-22 DIAGNOSIS — F79 Unspecified intellectual disabilities: Secondary | ICD-10-CM | POA: Diagnosis not present

## 2018-03-23 DIAGNOSIS — F79 Unspecified intellectual disabilities: Secondary | ICD-10-CM | POA: Diagnosis not present

## 2018-03-24 DIAGNOSIS — F79 Unspecified intellectual disabilities: Secondary | ICD-10-CM | POA: Diagnosis not present

## 2018-03-25 DIAGNOSIS — F79 Unspecified intellectual disabilities: Secondary | ICD-10-CM | POA: Diagnosis not present

## 2018-03-26 DIAGNOSIS — F79 Unspecified intellectual disabilities: Secondary | ICD-10-CM | POA: Diagnosis not present

## 2018-03-27 DIAGNOSIS — F79 Unspecified intellectual disabilities: Secondary | ICD-10-CM | POA: Diagnosis not present

## 2018-03-28 DIAGNOSIS — F79 Unspecified intellectual disabilities: Secondary | ICD-10-CM | POA: Diagnosis not present

## 2018-03-29 DIAGNOSIS — F79 Unspecified intellectual disabilities: Secondary | ICD-10-CM | POA: Diagnosis not present

## 2018-03-30 DIAGNOSIS — F79 Unspecified intellectual disabilities: Secondary | ICD-10-CM | POA: Diagnosis not present

## 2018-03-31 DIAGNOSIS — F79 Unspecified intellectual disabilities: Secondary | ICD-10-CM | POA: Diagnosis not present

## 2018-04-01 DIAGNOSIS — F79 Unspecified intellectual disabilities: Secondary | ICD-10-CM | POA: Diagnosis not present

## 2018-04-02 DIAGNOSIS — F79 Unspecified intellectual disabilities: Secondary | ICD-10-CM | POA: Diagnosis not present

## 2018-04-03 DIAGNOSIS — F79 Unspecified intellectual disabilities: Secondary | ICD-10-CM | POA: Diagnosis not present

## 2018-04-04 DIAGNOSIS — F79 Unspecified intellectual disabilities: Secondary | ICD-10-CM | POA: Diagnosis not present

## 2018-04-05 DIAGNOSIS — F79 Unspecified intellectual disabilities: Secondary | ICD-10-CM | POA: Diagnosis not present

## 2018-04-05 DIAGNOSIS — R32 Unspecified urinary incontinence: Secondary | ICD-10-CM | POA: Diagnosis not present

## 2018-04-06 DIAGNOSIS — F79 Unspecified intellectual disabilities: Secondary | ICD-10-CM | POA: Diagnosis not present

## 2018-04-07 DIAGNOSIS — F79 Unspecified intellectual disabilities: Secondary | ICD-10-CM | POA: Diagnosis not present

## 2018-04-08 DIAGNOSIS — F79 Unspecified intellectual disabilities: Secondary | ICD-10-CM | POA: Diagnosis not present

## 2018-04-09 DIAGNOSIS — F79 Unspecified intellectual disabilities: Secondary | ICD-10-CM | POA: Diagnosis not present

## 2018-04-10 DIAGNOSIS — F79 Unspecified intellectual disabilities: Secondary | ICD-10-CM | POA: Diagnosis not present

## 2018-04-11 DIAGNOSIS — F79 Unspecified intellectual disabilities: Secondary | ICD-10-CM | POA: Diagnosis not present

## 2018-04-12 DIAGNOSIS — F79 Unspecified intellectual disabilities: Secondary | ICD-10-CM | POA: Diagnosis not present

## 2018-04-13 DIAGNOSIS — F79 Unspecified intellectual disabilities: Secondary | ICD-10-CM | POA: Diagnosis not present

## 2018-04-14 DIAGNOSIS — F79 Unspecified intellectual disabilities: Secondary | ICD-10-CM | POA: Diagnosis not present

## 2018-04-15 DIAGNOSIS — F79 Unspecified intellectual disabilities: Secondary | ICD-10-CM | POA: Diagnosis not present

## 2018-04-16 DIAGNOSIS — F79 Unspecified intellectual disabilities: Secondary | ICD-10-CM | POA: Diagnosis not present

## 2018-04-17 DIAGNOSIS — F79 Unspecified intellectual disabilities: Secondary | ICD-10-CM | POA: Diagnosis not present

## 2018-04-28 DIAGNOSIS — F79 Unspecified intellectual disabilities: Secondary | ICD-10-CM | POA: Diagnosis not present

## 2018-04-29 DIAGNOSIS — F79 Unspecified intellectual disabilities: Secondary | ICD-10-CM | POA: Diagnosis not present

## 2018-04-30 DIAGNOSIS — F79 Unspecified intellectual disabilities: Secondary | ICD-10-CM | POA: Diagnosis not present

## 2018-05-01 DIAGNOSIS — F79 Unspecified intellectual disabilities: Secondary | ICD-10-CM | POA: Diagnosis not present

## 2018-05-02 DIAGNOSIS — F79 Unspecified intellectual disabilities: Secondary | ICD-10-CM | POA: Diagnosis not present

## 2018-05-02 DIAGNOSIS — R32 Unspecified urinary incontinence: Secondary | ICD-10-CM | POA: Diagnosis not present

## 2018-05-03 DIAGNOSIS — F79 Unspecified intellectual disabilities: Secondary | ICD-10-CM | POA: Diagnosis not present

## 2018-05-04 DIAGNOSIS — F79 Unspecified intellectual disabilities: Secondary | ICD-10-CM | POA: Diagnosis not present

## 2018-05-05 DIAGNOSIS — I509 Heart failure, unspecified: Secondary | ICD-10-CM | POA: Diagnosis not present

## 2018-05-05 DIAGNOSIS — N390 Urinary tract infection, site not specified: Secondary | ICD-10-CM | POA: Diagnosis not present

## 2018-05-05 DIAGNOSIS — I10 Essential (primary) hypertension: Secondary | ICD-10-CM | POA: Diagnosis not present

## 2018-05-05 DIAGNOSIS — M545 Low back pain: Secondary | ICD-10-CM | POA: Diagnosis not present

## 2018-05-05 DIAGNOSIS — F79 Unspecified intellectual disabilities: Secondary | ICD-10-CM | POA: Diagnosis not present

## 2018-05-06 DIAGNOSIS — F79 Unspecified intellectual disabilities: Secondary | ICD-10-CM | POA: Diagnosis not present

## 2018-05-06 DIAGNOSIS — N390 Urinary tract infection, site not specified: Secondary | ICD-10-CM | POA: Diagnosis not present

## 2018-05-07 DIAGNOSIS — F79 Unspecified intellectual disabilities: Secondary | ICD-10-CM | POA: Diagnosis not present

## 2018-05-08 DIAGNOSIS — F79 Unspecified intellectual disabilities: Secondary | ICD-10-CM | POA: Diagnosis not present

## 2018-05-09 DIAGNOSIS — F79 Unspecified intellectual disabilities: Secondary | ICD-10-CM | POA: Diagnosis not present

## 2018-05-10 DIAGNOSIS — F79 Unspecified intellectual disabilities: Secondary | ICD-10-CM | POA: Diagnosis not present

## 2018-05-12 DIAGNOSIS — F79 Unspecified intellectual disabilities: Secondary | ICD-10-CM | POA: Diagnosis not present

## 2018-05-13 DIAGNOSIS — F79 Unspecified intellectual disabilities: Secondary | ICD-10-CM | POA: Diagnosis not present

## 2018-05-14 DIAGNOSIS — F79 Unspecified intellectual disabilities: Secondary | ICD-10-CM | POA: Diagnosis not present

## 2018-05-15 DIAGNOSIS — F79 Unspecified intellectual disabilities: Secondary | ICD-10-CM | POA: Diagnosis not present

## 2018-05-16 DIAGNOSIS — F79 Unspecified intellectual disabilities: Secondary | ICD-10-CM | POA: Diagnosis not present

## 2018-05-17 DIAGNOSIS — F79 Unspecified intellectual disabilities: Secondary | ICD-10-CM | POA: Diagnosis not present

## 2018-05-18 DIAGNOSIS — F79 Unspecified intellectual disabilities: Secondary | ICD-10-CM | POA: Diagnosis not present

## 2018-05-19 DIAGNOSIS — F79 Unspecified intellectual disabilities: Secondary | ICD-10-CM | POA: Diagnosis not present

## 2018-05-20 DIAGNOSIS — F79 Unspecified intellectual disabilities: Secondary | ICD-10-CM | POA: Diagnosis not present

## 2018-05-21 DIAGNOSIS — F79 Unspecified intellectual disabilities: Secondary | ICD-10-CM | POA: Diagnosis not present

## 2018-05-22 DIAGNOSIS — F79 Unspecified intellectual disabilities: Secondary | ICD-10-CM | POA: Diagnosis not present

## 2018-05-23 DIAGNOSIS — M159 Polyosteoarthritis, unspecified: Secondary | ICD-10-CM | POA: Diagnosis not present

## 2018-05-23 DIAGNOSIS — I509 Heart failure, unspecified: Secondary | ICD-10-CM | POA: Diagnosis not present

## 2018-05-23 DIAGNOSIS — J449 Chronic obstructive pulmonary disease, unspecified: Secondary | ICD-10-CM | POA: Diagnosis not present

## 2018-05-23 DIAGNOSIS — E119 Type 2 diabetes mellitus without complications: Secondary | ICD-10-CM | POA: Diagnosis not present

## 2018-05-23 DIAGNOSIS — D649 Anemia, unspecified: Secondary | ICD-10-CM | POA: Diagnosis not present

## 2018-05-23 DIAGNOSIS — R300 Dysuria: Secondary | ICD-10-CM | POA: Diagnosis not present

## 2018-05-23 DIAGNOSIS — D696 Thrombocytopenia, unspecified: Secondary | ICD-10-CM | POA: Diagnosis not present

## 2018-05-23 DIAGNOSIS — M545 Low back pain: Secondary | ICD-10-CM | POA: Diagnosis not present

## 2018-05-23 DIAGNOSIS — F79 Unspecified intellectual disabilities: Secondary | ICD-10-CM | POA: Diagnosis not present

## 2018-05-23 DIAGNOSIS — I110 Hypertensive heart disease with heart failure: Secondary | ICD-10-CM | POA: Diagnosis not present

## 2018-05-24 DIAGNOSIS — D696 Thrombocytopenia, unspecified: Secondary | ICD-10-CM | POA: Diagnosis not present

## 2018-05-24 DIAGNOSIS — M159 Polyosteoarthritis, unspecified: Secondary | ICD-10-CM | POA: Diagnosis not present

## 2018-05-24 DIAGNOSIS — F79 Unspecified intellectual disabilities: Secondary | ICD-10-CM | POA: Diagnosis not present

## 2018-05-24 DIAGNOSIS — I110 Hypertensive heart disease with heart failure: Secondary | ICD-10-CM | POA: Diagnosis not present

## 2018-05-24 DIAGNOSIS — J449 Chronic obstructive pulmonary disease, unspecified: Secondary | ICD-10-CM | POA: Diagnosis not present

## 2018-05-24 DIAGNOSIS — D649 Anemia, unspecified: Secondary | ICD-10-CM | POA: Diagnosis not present

## 2018-05-24 DIAGNOSIS — N390 Urinary tract infection, site not specified: Secondary | ICD-10-CM | POA: Diagnosis not present

## 2018-05-24 DIAGNOSIS — E119 Type 2 diabetes mellitus without complications: Secondary | ICD-10-CM | POA: Diagnosis not present

## 2018-05-24 DIAGNOSIS — R300 Dysuria: Secondary | ICD-10-CM | POA: Diagnosis not present

## 2018-05-24 DIAGNOSIS — M545 Low back pain: Secondary | ICD-10-CM | POA: Diagnosis not present

## 2018-05-24 DIAGNOSIS — I509 Heart failure, unspecified: Secondary | ICD-10-CM | POA: Diagnosis not present

## 2018-05-25 DIAGNOSIS — E119 Type 2 diabetes mellitus without complications: Secondary | ICD-10-CM | POA: Diagnosis not present

## 2018-05-25 DIAGNOSIS — R300 Dysuria: Secondary | ICD-10-CM | POA: Diagnosis not present

## 2018-05-25 DIAGNOSIS — D649 Anemia, unspecified: Secondary | ICD-10-CM | POA: Diagnosis not present

## 2018-05-25 DIAGNOSIS — I509 Heart failure, unspecified: Secondary | ICD-10-CM | POA: Diagnosis not present

## 2018-05-25 DIAGNOSIS — J449 Chronic obstructive pulmonary disease, unspecified: Secondary | ICD-10-CM | POA: Diagnosis not present

## 2018-05-25 DIAGNOSIS — M159 Polyosteoarthritis, unspecified: Secondary | ICD-10-CM | POA: Diagnosis not present

## 2018-05-25 DIAGNOSIS — M545 Low back pain: Secondary | ICD-10-CM | POA: Diagnosis not present

## 2018-05-25 DIAGNOSIS — F79 Unspecified intellectual disabilities: Secondary | ICD-10-CM | POA: Diagnosis not present

## 2018-05-25 DIAGNOSIS — I110 Hypertensive heart disease with heart failure: Secondary | ICD-10-CM | POA: Diagnosis not present

## 2018-05-25 DIAGNOSIS — D696 Thrombocytopenia, unspecified: Secondary | ICD-10-CM | POA: Diagnosis not present

## 2018-05-26 DIAGNOSIS — F79 Unspecified intellectual disabilities: Secondary | ICD-10-CM | POA: Diagnosis not present

## 2018-05-27 DIAGNOSIS — F79 Unspecified intellectual disabilities: Secondary | ICD-10-CM | POA: Diagnosis not present

## 2018-05-28 DIAGNOSIS — F79 Unspecified intellectual disabilities: Secondary | ICD-10-CM | POA: Diagnosis not present

## 2018-05-29 DIAGNOSIS — F79 Unspecified intellectual disabilities: Secondary | ICD-10-CM | POA: Diagnosis not present

## 2018-05-30 DIAGNOSIS — F79 Unspecified intellectual disabilities: Secondary | ICD-10-CM | POA: Diagnosis not present

## 2018-05-31 DIAGNOSIS — F79 Unspecified intellectual disabilities: Secondary | ICD-10-CM | POA: Diagnosis not present

## 2018-06-01 DIAGNOSIS — F79 Unspecified intellectual disabilities: Secondary | ICD-10-CM | POA: Diagnosis not present

## 2018-06-02 DIAGNOSIS — E119 Type 2 diabetes mellitus without complications: Secondary | ICD-10-CM | POA: Diagnosis not present

## 2018-06-02 DIAGNOSIS — I509 Heart failure, unspecified: Secondary | ICD-10-CM | POA: Diagnosis not present

## 2018-06-02 DIAGNOSIS — I110 Hypertensive heart disease with heart failure: Secondary | ICD-10-CM | POA: Diagnosis not present

## 2018-06-02 DIAGNOSIS — M545 Low back pain: Secondary | ICD-10-CM | POA: Diagnosis not present

## 2018-06-02 DIAGNOSIS — D696 Thrombocytopenia, unspecified: Secondary | ICD-10-CM | POA: Diagnosis not present

## 2018-06-02 DIAGNOSIS — M159 Polyosteoarthritis, unspecified: Secondary | ICD-10-CM | POA: Diagnosis not present

## 2018-06-02 DIAGNOSIS — D649 Anemia, unspecified: Secondary | ICD-10-CM | POA: Diagnosis not present

## 2018-06-02 DIAGNOSIS — J449 Chronic obstructive pulmonary disease, unspecified: Secondary | ICD-10-CM | POA: Diagnosis not present

## 2018-06-02 DIAGNOSIS — R300 Dysuria: Secondary | ICD-10-CM | POA: Diagnosis not present

## 2018-06-02 DIAGNOSIS — F79 Unspecified intellectual disabilities: Secondary | ICD-10-CM | POA: Diagnosis not present

## 2018-06-03 DIAGNOSIS — J449 Chronic obstructive pulmonary disease, unspecified: Secondary | ICD-10-CM | POA: Diagnosis not present

## 2018-06-03 DIAGNOSIS — M545 Low back pain: Secondary | ICD-10-CM | POA: Diagnosis not present

## 2018-06-03 DIAGNOSIS — M159 Polyosteoarthritis, unspecified: Secondary | ICD-10-CM | POA: Diagnosis not present

## 2018-06-03 DIAGNOSIS — I509 Heart failure, unspecified: Secondary | ICD-10-CM | POA: Diagnosis not present

## 2018-06-03 DIAGNOSIS — D696 Thrombocytopenia, unspecified: Secondary | ICD-10-CM | POA: Diagnosis not present

## 2018-06-03 DIAGNOSIS — E119 Type 2 diabetes mellitus without complications: Secondary | ICD-10-CM | POA: Diagnosis not present

## 2018-06-03 DIAGNOSIS — R300 Dysuria: Secondary | ICD-10-CM | POA: Diagnosis not present

## 2018-06-03 DIAGNOSIS — F79 Unspecified intellectual disabilities: Secondary | ICD-10-CM | POA: Diagnosis not present

## 2018-06-03 DIAGNOSIS — I110 Hypertensive heart disease with heart failure: Secondary | ICD-10-CM | POA: Diagnosis not present

## 2018-06-03 DIAGNOSIS — D649 Anemia, unspecified: Secondary | ICD-10-CM | POA: Diagnosis not present

## 2018-06-04 DIAGNOSIS — F79 Unspecified intellectual disabilities: Secondary | ICD-10-CM | POA: Diagnosis not present

## 2018-06-05 DIAGNOSIS — F79 Unspecified intellectual disabilities: Secondary | ICD-10-CM | POA: Diagnosis not present

## 2018-06-06 DIAGNOSIS — E119 Type 2 diabetes mellitus without complications: Secondary | ICD-10-CM | POA: Diagnosis not present

## 2018-06-06 DIAGNOSIS — D518 Other vitamin B12 deficiency anemias: Secondary | ICD-10-CM | POA: Diagnosis not present

## 2018-06-06 DIAGNOSIS — E038 Other specified hypothyroidism: Secondary | ICD-10-CM | POA: Diagnosis not present

## 2018-06-07 DIAGNOSIS — R300 Dysuria: Secondary | ICD-10-CM | POA: Diagnosis not present

## 2018-06-07 DIAGNOSIS — M159 Polyosteoarthritis, unspecified: Secondary | ICD-10-CM | POA: Diagnosis not present

## 2018-06-07 DIAGNOSIS — E119 Type 2 diabetes mellitus without complications: Secondary | ICD-10-CM | POA: Diagnosis not present

## 2018-06-07 DIAGNOSIS — D696 Thrombocytopenia, unspecified: Secondary | ICD-10-CM | POA: Diagnosis not present

## 2018-06-07 DIAGNOSIS — R32 Unspecified urinary incontinence: Secondary | ICD-10-CM | POA: Diagnosis not present

## 2018-06-07 DIAGNOSIS — I509 Heart failure, unspecified: Secondary | ICD-10-CM | POA: Diagnosis not present

## 2018-06-07 DIAGNOSIS — J449 Chronic obstructive pulmonary disease, unspecified: Secondary | ICD-10-CM | POA: Diagnosis not present

## 2018-06-07 DIAGNOSIS — M545 Low back pain: Secondary | ICD-10-CM | POA: Diagnosis not present

## 2018-06-07 DIAGNOSIS — I110 Hypertensive heart disease with heart failure: Secondary | ICD-10-CM | POA: Diagnosis not present

## 2018-06-07 DIAGNOSIS — D649 Anemia, unspecified: Secondary | ICD-10-CM | POA: Diagnosis not present

## 2018-06-08 DIAGNOSIS — R300 Dysuria: Secondary | ICD-10-CM | POA: Diagnosis not present

## 2018-06-08 DIAGNOSIS — N390 Urinary tract infection, site not specified: Secondary | ICD-10-CM | POA: Diagnosis not present

## 2018-06-08 DIAGNOSIS — M545 Low back pain: Secondary | ICD-10-CM | POA: Diagnosis not present

## 2018-06-08 DIAGNOSIS — D649 Anemia, unspecified: Secondary | ICD-10-CM | POA: Diagnosis not present

## 2018-06-08 DIAGNOSIS — I110 Hypertensive heart disease with heart failure: Secondary | ICD-10-CM | POA: Diagnosis not present

## 2018-06-08 DIAGNOSIS — D696 Thrombocytopenia, unspecified: Secondary | ICD-10-CM | POA: Diagnosis not present

## 2018-06-08 DIAGNOSIS — I509 Heart failure, unspecified: Secondary | ICD-10-CM | POA: Diagnosis not present

## 2018-06-08 DIAGNOSIS — E119 Type 2 diabetes mellitus without complications: Secondary | ICD-10-CM | POA: Diagnosis not present

## 2018-06-08 DIAGNOSIS — M159 Polyosteoarthritis, unspecified: Secondary | ICD-10-CM | POA: Diagnosis not present

## 2018-06-08 DIAGNOSIS — J449 Chronic obstructive pulmonary disease, unspecified: Secondary | ICD-10-CM | POA: Diagnosis not present

## 2018-06-09 DIAGNOSIS — E559 Vitamin D deficiency, unspecified: Secondary | ICD-10-CM | POA: Diagnosis not present

## 2018-06-09 DIAGNOSIS — Z79899 Other long term (current) drug therapy: Secondary | ICD-10-CM | POA: Diagnosis not present

## 2018-06-09 DIAGNOSIS — E038 Other specified hypothyroidism: Secondary | ICD-10-CM | POA: Diagnosis not present

## 2018-06-09 DIAGNOSIS — D518 Other vitamin B12 deficiency anemias: Secondary | ICD-10-CM | POA: Diagnosis not present

## 2018-06-09 DIAGNOSIS — E119 Type 2 diabetes mellitus without complications: Secondary | ICD-10-CM | POA: Diagnosis not present

## 2018-06-09 DIAGNOSIS — E7849 Other hyperlipidemia: Secondary | ICD-10-CM | POA: Diagnosis not present

## 2018-06-13 DIAGNOSIS — M159 Polyosteoarthritis, unspecified: Secondary | ICD-10-CM | POA: Diagnosis not present

## 2018-06-13 DIAGNOSIS — E119 Type 2 diabetes mellitus without complications: Secondary | ICD-10-CM | POA: Diagnosis not present

## 2018-06-13 DIAGNOSIS — M545 Low back pain: Secondary | ICD-10-CM | POA: Diagnosis not present

## 2018-06-13 DIAGNOSIS — D649 Anemia, unspecified: Secondary | ICD-10-CM | POA: Diagnosis not present

## 2018-06-13 DIAGNOSIS — R300 Dysuria: Secondary | ICD-10-CM | POA: Diagnosis not present

## 2018-06-13 DIAGNOSIS — I509 Heart failure, unspecified: Secondary | ICD-10-CM | POA: Diagnosis not present

## 2018-06-13 DIAGNOSIS — J449 Chronic obstructive pulmonary disease, unspecified: Secondary | ICD-10-CM | POA: Diagnosis not present

## 2018-06-13 DIAGNOSIS — D696 Thrombocytopenia, unspecified: Secondary | ICD-10-CM | POA: Diagnosis not present

## 2018-06-13 DIAGNOSIS — I110 Hypertensive heart disease with heart failure: Secondary | ICD-10-CM | POA: Diagnosis not present

## 2018-06-13 DIAGNOSIS — F79 Unspecified intellectual disabilities: Secondary | ICD-10-CM | POA: Diagnosis not present

## 2018-06-14 DIAGNOSIS — F79 Unspecified intellectual disabilities: Secondary | ICD-10-CM | POA: Diagnosis not present

## 2018-06-15 DIAGNOSIS — I509 Heart failure, unspecified: Secondary | ICD-10-CM | POA: Diagnosis not present

## 2018-06-15 DIAGNOSIS — R300 Dysuria: Secondary | ICD-10-CM | POA: Diagnosis not present

## 2018-06-15 DIAGNOSIS — D649 Anemia, unspecified: Secondary | ICD-10-CM | POA: Diagnosis not present

## 2018-06-15 DIAGNOSIS — D696 Thrombocytopenia, unspecified: Secondary | ICD-10-CM | POA: Diagnosis not present

## 2018-06-15 DIAGNOSIS — I110 Hypertensive heart disease with heart failure: Secondary | ICD-10-CM | POA: Diagnosis not present

## 2018-06-15 DIAGNOSIS — M545 Low back pain: Secondary | ICD-10-CM | POA: Diagnosis not present

## 2018-06-15 DIAGNOSIS — F79 Unspecified intellectual disabilities: Secondary | ICD-10-CM | POA: Diagnosis not present

## 2018-06-15 DIAGNOSIS — E119 Type 2 diabetes mellitus without complications: Secondary | ICD-10-CM | POA: Diagnosis not present

## 2018-06-15 DIAGNOSIS — M159 Polyosteoarthritis, unspecified: Secondary | ICD-10-CM | POA: Diagnosis not present

## 2018-06-15 DIAGNOSIS — J449 Chronic obstructive pulmonary disease, unspecified: Secondary | ICD-10-CM | POA: Diagnosis not present

## 2018-06-16 DIAGNOSIS — F79 Unspecified intellectual disabilities: Secondary | ICD-10-CM | POA: Diagnosis not present

## 2018-06-16 DIAGNOSIS — R4189 Other symptoms and signs involving cognitive functions and awareness: Secondary | ICD-10-CM | POA: Diagnosis not present

## 2018-06-16 DIAGNOSIS — I10 Essential (primary) hypertension: Secondary | ICD-10-CM | POA: Diagnosis not present

## 2018-06-16 DIAGNOSIS — N3000 Acute cystitis without hematuria: Secondary | ICD-10-CM | POA: Diagnosis not present

## 2018-06-17 DIAGNOSIS — F79 Unspecified intellectual disabilities: Secondary | ICD-10-CM | POA: Diagnosis not present

## 2018-06-18 DIAGNOSIS — F79 Unspecified intellectual disabilities: Secondary | ICD-10-CM | POA: Diagnosis not present

## 2018-06-19 DIAGNOSIS — F79 Unspecified intellectual disabilities: Secondary | ICD-10-CM | POA: Diagnosis not present

## 2018-06-20 DIAGNOSIS — D649 Anemia, unspecified: Secondary | ICD-10-CM | POA: Diagnosis not present

## 2018-06-20 DIAGNOSIS — E119 Type 2 diabetes mellitus without complications: Secondary | ICD-10-CM | POA: Diagnosis not present

## 2018-06-20 DIAGNOSIS — I509 Heart failure, unspecified: Secondary | ICD-10-CM | POA: Diagnosis not present

## 2018-06-20 DIAGNOSIS — M545 Low back pain: Secondary | ICD-10-CM | POA: Diagnosis not present

## 2018-06-20 DIAGNOSIS — F79 Unspecified intellectual disabilities: Secondary | ICD-10-CM | POA: Diagnosis not present

## 2018-06-20 DIAGNOSIS — D696 Thrombocytopenia, unspecified: Secondary | ICD-10-CM | POA: Diagnosis not present

## 2018-06-20 DIAGNOSIS — I110 Hypertensive heart disease with heart failure: Secondary | ICD-10-CM | POA: Diagnosis not present

## 2018-06-20 DIAGNOSIS — J449 Chronic obstructive pulmonary disease, unspecified: Secondary | ICD-10-CM | POA: Diagnosis not present

## 2018-06-20 DIAGNOSIS — M159 Polyosteoarthritis, unspecified: Secondary | ICD-10-CM | POA: Diagnosis not present

## 2018-06-20 DIAGNOSIS — R300 Dysuria: Secondary | ICD-10-CM | POA: Diagnosis not present

## 2018-06-21 DIAGNOSIS — F79 Unspecified intellectual disabilities: Secondary | ICD-10-CM | POA: Diagnosis not present

## 2018-06-22 DIAGNOSIS — D696 Thrombocytopenia, unspecified: Secondary | ICD-10-CM | POA: Diagnosis not present

## 2018-06-22 DIAGNOSIS — F79 Unspecified intellectual disabilities: Secondary | ICD-10-CM | POA: Diagnosis not present

## 2018-06-22 DIAGNOSIS — M545 Low back pain: Secondary | ICD-10-CM | POA: Diagnosis not present

## 2018-06-22 DIAGNOSIS — E119 Type 2 diabetes mellitus without complications: Secondary | ICD-10-CM | POA: Diagnosis not present

## 2018-06-22 DIAGNOSIS — R300 Dysuria: Secondary | ICD-10-CM | POA: Diagnosis not present

## 2018-06-22 DIAGNOSIS — J449 Chronic obstructive pulmonary disease, unspecified: Secondary | ICD-10-CM | POA: Diagnosis not present

## 2018-06-22 DIAGNOSIS — I110 Hypertensive heart disease with heart failure: Secondary | ICD-10-CM | POA: Diagnosis not present

## 2018-06-22 DIAGNOSIS — M159 Polyosteoarthritis, unspecified: Secondary | ICD-10-CM | POA: Diagnosis not present

## 2018-06-22 DIAGNOSIS — I509 Heart failure, unspecified: Secondary | ICD-10-CM | POA: Diagnosis not present

## 2018-06-22 DIAGNOSIS — D649 Anemia, unspecified: Secondary | ICD-10-CM | POA: Diagnosis not present

## 2018-06-23 DIAGNOSIS — F79 Unspecified intellectual disabilities: Secondary | ICD-10-CM | POA: Diagnosis not present

## 2018-06-24 DIAGNOSIS — F79 Unspecified intellectual disabilities: Secondary | ICD-10-CM | POA: Diagnosis not present

## 2018-06-25 DIAGNOSIS — F79 Unspecified intellectual disabilities: Secondary | ICD-10-CM | POA: Diagnosis not present

## 2018-06-26 DIAGNOSIS — F79 Unspecified intellectual disabilities: Secondary | ICD-10-CM | POA: Diagnosis not present

## 2018-06-27 DIAGNOSIS — M159 Polyosteoarthritis, unspecified: Secondary | ICD-10-CM | POA: Diagnosis not present

## 2018-06-27 DIAGNOSIS — E119 Type 2 diabetes mellitus without complications: Secondary | ICD-10-CM | POA: Diagnosis not present

## 2018-06-27 DIAGNOSIS — M545 Low back pain: Secondary | ICD-10-CM | POA: Diagnosis not present

## 2018-06-27 DIAGNOSIS — I509 Heart failure, unspecified: Secondary | ICD-10-CM | POA: Diagnosis not present

## 2018-06-27 DIAGNOSIS — F79 Unspecified intellectual disabilities: Secondary | ICD-10-CM | POA: Diagnosis not present

## 2018-06-27 DIAGNOSIS — D649 Anemia, unspecified: Secondary | ICD-10-CM | POA: Diagnosis not present

## 2018-06-27 DIAGNOSIS — R300 Dysuria: Secondary | ICD-10-CM | POA: Diagnosis not present

## 2018-06-27 DIAGNOSIS — D696 Thrombocytopenia, unspecified: Secondary | ICD-10-CM | POA: Diagnosis not present

## 2018-06-27 DIAGNOSIS — J449 Chronic obstructive pulmonary disease, unspecified: Secondary | ICD-10-CM | POA: Diagnosis not present

## 2018-06-27 DIAGNOSIS — I110 Hypertensive heart disease with heart failure: Secondary | ICD-10-CM | POA: Diagnosis not present

## 2018-06-28 DIAGNOSIS — F79 Unspecified intellectual disabilities: Secondary | ICD-10-CM | POA: Diagnosis not present

## 2018-06-29 DIAGNOSIS — I509 Heart failure, unspecified: Secondary | ICD-10-CM | POA: Diagnosis not present

## 2018-06-29 DIAGNOSIS — M159 Polyosteoarthritis, unspecified: Secondary | ICD-10-CM | POA: Diagnosis not present

## 2018-06-29 DIAGNOSIS — J449 Chronic obstructive pulmonary disease, unspecified: Secondary | ICD-10-CM | POA: Diagnosis not present

## 2018-06-29 DIAGNOSIS — D649 Anemia, unspecified: Secondary | ICD-10-CM | POA: Diagnosis not present

## 2018-06-29 DIAGNOSIS — D696 Thrombocytopenia, unspecified: Secondary | ICD-10-CM | POA: Diagnosis not present

## 2018-06-29 DIAGNOSIS — F79 Unspecified intellectual disabilities: Secondary | ICD-10-CM | POA: Diagnosis not present

## 2018-06-29 DIAGNOSIS — R300 Dysuria: Secondary | ICD-10-CM | POA: Diagnosis not present

## 2018-06-29 DIAGNOSIS — E119 Type 2 diabetes mellitus without complications: Secondary | ICD-10-CM | POA: Diagnosis not present

## 2018-06-29 DIAGNOSIS — I110 Hypertensive heart disease with heart failure: Secondary | ICD-10-CM | POA: Diagnosis not present

## 2018-06-29 DIAGNOSIS — M545 Low back pain: Secondary | ICD-10-CM | POA: Diagnosis not present

## 2018-06-30 DIAGNOSIS — R32 Unspecified urinary incontinence: Secondary | ICD-10-CM | POA: Diagnosis not present

## 2018-06-30 DIAGNOSIS — F79 Unspecified intellectual disabilities: Secondary | ICD-10-CM | POA: Diagnosis not present

## 2018-07-01 DIAGNOSIS — F79 Unspecified intellectual disabilities: Secondary | ICD-10-CM | POA: Diagnosis not present

## 2018-07-02 DIAGNOSIS — F79 Unspecified intellectual disabilities: Secondary | ICD-10-CM | POA: Diagnosis not present

## 2018-07-03 DIAGNOSIS — F79 Unspecified intellectual disabilities: Secondary | ICD-10-CM | POA: Diagnosis not present

## 2018-07-04 DIAGNOSIS — D649 Anemia, unspecified: Secondary | ICD-10-CM | POA: Diagnosis not present

## 2018-07-04 DIAGNOSIS — R300 Dysuria: Secondary | ICD-10-CM | POA: Diagnosis not present

## 2018-07-04 DIAGNOSIS — E119 Type 2 diabetes mellitus without complications: Secondary | ICD-10-CM | POA: Diagnosis not present

## 2018-07-04 DIAGNOSIS — M545 Low back pain: Secondary | ICD-10-CM | POA: Diagnosis not present

## 2018-07-04 DIAGNOSIS — M159 Polyosteoarthritis, unspecified: Secondary | ICD-10-CM | POA: Diagnosis not present

## 2018-07-04 DIAGNOSIS — I110 Hypertensive heart disease with heart failure: Secondary | ICD-10-CM | POA: Diagnosis not present

## 2018-07-04 DIAGNOSIS — D696 Thrombocytopenia, unspecified: Secondary | ICD-10-CM | POA: Diagnosis not present

## 2018-07-04 DIAGNOSIS — J449 Chronic obstructive pulmonary disease, unspecified: Secondary | ICD-10-CM | POA: Diagnosis not present

## 2018-07-04 DIAGNOSIS — F79 Unspecified intellectual disabilities: Secondary | ICD-10-CM | POA: Diagnosis not present

## 2018-07-04 DIAGNOSIS — I509 Heart failure, unspecified: Secondary | ICD-10-CM | POA: Diagnosis not present

## 2018-07-05 DIAGNOSIS — F79 Unspecified intellectual disabilities: Secondary | ICD-10-CM | POA: Diagnosis not present

## 2018-07-06 DIAGNOSIS — F79 Unspecified intellectual disabilities: Secondary | ICD-10-CM | POA: Diagnosis not present

## 2018-07-07 DIAGNOSIS — F79 Unspecified intellectual disabilities: Secondary | ICD-10-CM | POA: Diagnosis not present

## 2018-07-08 DIAGNOSIS — F79 Unspecified intellectual disabilities: Secondary | ICD-10-CM | POA: Diagnosis not present

## 2018-07-09 DIAGNOSIS — F79 Unspecified intellectual disabilities: Secondary | ICD-10-CM | POA: Diagnosis not present

## 2018-07-10 DIAGNOSIS — F79 Unspecified intellectual disabilities: Secondary | ICD-10-CM | POA: Diagnosis not present

## 2018-07-11 DIAGNOSIS — F79 Unspecified intellectual disabilities: Secondary | ICD-10-CM | POA: Diagnosis not present

## 2018-07-12 DIAGNOSIS — F79 Unspecified intellectual disabilities: Secondary | ICD-10-CM | POA: Diagnosis not present

## 2018-07-13 DIAGNOSIS — F79 Unspecified intellectual disabilities: Secondary | ICD-10-CM | POA: Diagnosis not present

## 2018-07-13 DIAGNOSIS — R300 Dysuria: Secondary | ICD-10-CM | POA: Diagnosis not present

## 2018-07-13 DIAGNOSIS — M159 Polyosteoarthritis, unspecified: Secondary | ICD-10-CM | POA: Diagnosis not present

## 2018-07-13 DIAGNOSIS — M545 Low back pain: Secondary | ICD-10-CM | POA: Diagnosis not present

## 2018-07-13 DIAGNOSIS — D696 Thrombocytopenia, unspecified: Secondary | ICD-10-CM | POA: Diagnosis not present

## 2018-07-13 DIAGNOSIS — I509 Heart failure, unspecified: Secondary | ICD-10-CM | POA: Diagnosis not present

## 2018-07-13 DIAGNOSIS — I110 Hypertensive heart disease with heart failure: Secondary | ICD-10-CM | POA: Diagnosis not present

## 2018-07-13 DIAGNOSIS — D649 Anemia, unspecified: Secondary | ICD-10-CM | POA: Diagnosis not present

## 2018-07-13 DIAGNOSIS — J449 Chronic obstructive pulmonary disease, unspecified: Secondary | ICD-10-CM | POA: Diagnosis not present

## 2018-07-13 DIAGNOSIS — E119 Type 2 diabetes mellitus without complications: Secondary | ICD-10-CM | POA: Diagnosis not present

## 2018-07-14 DIAGNOSIS — I739 Peripheral vascular disease, unspecified: Secondary | ICD-10-CM | POA: Diagnosis not present

## 2018-07-14 DIAGNOSIS — I10 Essential (primary) hypertension: Secondary | ICD-10-CM | POA: Diagnosis not present

## 2018-07-14 DIAGNOSIS — F79 Unspecified intellectual disabilities: Secondary | ICD-10-CM | POA: Diagnosis not present

## 2018-07-14 DIAGNOSIS — D1722 Benign lipomatous neoplasm of skin and subcutaneous tissue of left arm: Secondary | ICD-10-CM | POA: Diagnosis not present

## 2018-07-15 DIAGNOSIS — F79 Unspecified intellectual disabilities: Secondary | ICD-10-CM | POA: Diagnosis not present

## 2018-07-16 DIAGNOSIS — E119 Type 2 diabetes mellitus without complications: Secondary | ICD-10-CM | POA: Diagnosis not present

## 2018-07-16 DIAGNOSIS — E038 Other specified hypothyroidism: Secondary | ICD-10-CM | POA: Diagnosis not present

## 2018-07-16 DIAGNOSIS — F79 Unspecified intellectual disabilities: Secondary | ICD-10-CM | POA: Diagnosis not present

## 2018-07-16 DIAGNOSIS — D518 Other vitamin B12 deficiency anemias: Secondary | ICD-10-CM | POA: Diagnosis not present

## 2018-07-17 DIAGNOSIS — F79 Unspecified intellectual disabilities: Secondary | ICD-10-CM | POA: Diagnosis not present

## 2018-07-18 DIAGNOSIS — F79 Unspecified intellectual disabilities: Secondary | ICD-10-CM | POA: Diagnosis not present

## 2018-07-19 DIAGNOSIS — F79 Unspecified intellectual disabilities: Secondary | ICD-10-CM | POA: Diagnosis not present

## 2018-07-20 DIAGNOSIS — F79 Unspecified intellectual disabilities: Secondary | ICD-10-CM | POA: Diagnosis not present

## 2018-07-21 DIAGNOSIS — J449 Chronic obstructive pulmonary disease, unspecified: Secondary | ICD-10-CM | POA: Diagnosis not present

## 2018-07-21 DIAGNOSIS — M545 Low back pain: Secondary | ICD-10-CM | POA: Diagnosis not present

## 2018-07-21 DIAGNOSIS — R300 Dysuria: Secondary | ICD-10-CM | POA: Diagnosis not present

## 2018-07-21 DIAGNOSIS — F79 Unspecified intellectual disabilities: Secondary | ICD-10-CM | POA: Diagnosis not present

## 2018-07-21 DIAGNOSIS — D649 Anemia, unspecified: Secondary | ICD-10-CM | POA: Diagnosis not present

## 2018-07-21 DIAGNOSIS — E119 Type 2 diabetes mellitus without complications: Secondary | ICD-10-CM | POA: Diagnosis not present

## 2018-07-21 DIAGNOSIS — I110 Hypertensive heart disease with heart failure: Secondary | ICD-10-CM | POA: Diagnosis not present

## 2018-07-21 DIAGNOSIS — M159 Polyosteoarthritis, unspecified: Secondary | ICD-10-CM | POA: Diagnosis not present

## 2018-07-21 DIAGNOSIS — D696 Thrombocytopenia, unspecified: Secondary | ICD-10-CM | POA: Diagnosis not present

## 2018-07-21 DIAGNOSIS — I509 Heart failure, unspecified: Secondary | ICD-10-CM | POA: Diagnosis not present

## 2018-07-22 DIAGNOSIS — F79 Unspecified intellectual disabilities: Secondary | ICD-10-CM | POA: Diagnosis not present

## 2018-07-23 DIAGNOSIS — F79 Unspecified intellectual disabilities: Secondary | ICD-10-CM | POA: Diagnosis not present

## 2018-07-24 DIAGNOSIS — F79 Unspecified intellectual disabilities: Secondary | ICD-10-CM | POA: Diagnosis not present

## 2018-07-25 DIAGNOSIS — F79 Unspecified intellectual disabilities: Secondary | ICD-10-CM | POA: Diagnosis not present

## 2018-07-27 DIAGNOSIS — F79 Unspecified intellectual disabilities: Secondary | ICD-10-CM | POA: Diagnosis not present

## 2018-07-28 DIAGNOSIS — F79 Unspecified intellectual disabilities: Secondary | ICD-10-CM | POA: Diagnosis not present

## 2018-07-29 DIAGNOSIS — F79 Unspecified intellectual disabilities: Secondary | ICD-10-CM | POA: Diagnosis not present

## 2018-07-30 DIAGNOSIS — F79 Unspecified intellectual disabilities: Secondary | ICD-10-CM | POA: Diagnosis not present

## 2018-07-31 DIAGNOSIS — F79 Unspecified intellectual disabilities: Secondary | ICD-10-CM | POA: Diagnosis not present

## 2018-08-01 DIAGNOSIS — F79 Unspecified intellectual disabilities: Secondary | ICD-10-CM | POA: Diagnosis not present

## 2018-08-01 DIAGNOSIS — R32 Unspecified urinary incontinence: Secondary | ICD-10-CM | POA: Diagnosis not present

## 2018-08-02 DIAGNOSIS — F79 Unspecified intellectual disabilities: Secondary | ICD-10-CM | POA: Diagnosis not present

## 2018-08-03 DIAGNOSIS — F79 Unspecified intellectual disabilities: Secondary | ICD-10-CM | POA: Diagnosis not present

## 2018-08-04 DIAGNOSIS — F79 Unspecified intellectual disabilities: Secondary | ICD-10-CM | POA: Diagnosis not present

## 2018-08-05 DIAGNOSIS — F79 Unspecified intellectual disabilities: Secondary | ICD-10-CM | POA: Diagnosis not present

## 2018-08-06 DIAGNOSIS — F79 Unspecified intellectual disabilities: Secondary | ICD-10-CM | POA: Diagnosis not present

## 2018-08-07 DIAGNOSIS — F79 Unspecified intellectual disabilities: Secondary | ICD-10-CM | POA: Diagnosis not present

## 2018-08-16 DIAGNOSIS — E038 Other specified hypothyroidism: Secondary | ICD-10-CM | POA: Diagnosis not present

## 2018-08-16 DIAGNOSIS — D518 Other vitamin B12 deficiency anemias: Secondary | ICD-10-CM | POA: Diagnosis not present

## 2018-08-16 DIAGNOSIS — E119 Type 2 diabetes mellitus without complications: Secondary | ICD-10-CM | POA: Diagnosis not present

## 2018-08-25 DIAGNOSIS — I509 Heart failure, unspecified: Secondary | ICD-10-CM | POA: Diagnosis not present

## 2018-08-25 DIAGNOSIS — I10 Essential (primary) hypertension: Secondary | ICD-10-CM | POA: Diagnosis not present

## 2018-08-25 DIAGNOSIS — R4189 Other symptoms and signs involving cognitive functions and awareness: Secondary | ICD-10-CM | POA: Diagnosis not present

## 2018-09-01 DIAGNOSIS — D518 Other vitamin B12 deficiency anemias: Secondary | ICD-10-CM | POA: Diagnosis not present

## 2018-09-01 DIAGNOSIS — Z79899 Other long term (current) drug therapy: Secondary | ICD-10-CM | POA: Diagnosis not present

## 2018-09-01 DIAGNOSIS — E559 Vitamin D deficiency, unspecified: Secondary | ICD-10-CM | POA: Diagnosis not present

## 2018-09-01 DIAGNOSIS — E119 Type 2 diabetes mellitus without complications: Secondary | ICD-10-CM | POA: Diagnosis not present

## 2018-09-01 DIAGNOSIS — E7849 Other hyperlipidemia: Secondary | ICD-10-CM | POA: Diagnosis not present

## 2018-09-01 DIAGNOSIS — E038 Other specified hypothyroidism: Secondary | ICD-10-CM | POA: Diagnosis not present

## 2018-09-03 DIAGNOSIS — D518 Other vitamin B12 deficiency anemias: Secondary | ICD-10-CM | POA: Diagnosis not present

## 2018-09-03 DIAGNOSIS — E119 Type 2 diabetes mellitus without complications: Secondary | ICD-10-CM | POA: Diagnosis not present

## 2018-09-03 DIAGNOSIS — E038 Other specified hypothyroidism: Secondary | ICD-10-CM | POA: Diagnosis not present

## 2022-05-07 DIAGNOSIS — Z79899 Other long term (current) drug therapy: Secondary | ICD-10-CM | POA: Diagnosis not present

## 2022-05-07 DIAGNOSIS — D518 Other vitamin B12 deficiency anemias: Secondary | ICD-10-CM | POA: Diagnosis not present

## 2022-05-07 DIAGNOSIS — E119 Type 2 diabetes mellitus without complications: Secondary | ICD-10-CM | POA: Diagnosis not present

## 2022-05-07 DIAGNOSIS — E782 Mixed hyperlipidemia: Secondary | ICD-10-CM | POA: Diagnosis not present

## 2022-05-13 DIAGNOSIS — E119 Type 2 diabetes mellitus without complications: Secondary | ICD-10-CM | POA: Diagnosis not present

## 2022-05-13 DIAGNOSIS — M150 Primary generalized (osteo)arthritis: Secondary | ICD-10-CM | POA: Diagnosis not present

## 2022-05-13 DIAGNOSIS — D518 Other vitamin B12 deficiency anemias: Secondary | ICD-10-CM | POA: Diagnosis not present

## 2022-05-13 DIAGNOSIS — I129 Hypertensive chronic kidney disease with stage 1 through stage 4 chronic kidney disease, or unspecified chronic kidney disease: Secondary | ICD-10-CM | POA: Diagnosis not present

## 2022-05-13 DIAGNOSIS — E038 Other specified hypothyroidism: Secondary | ICD-10-CM | POA: Diagnosis not present

## 2022-05-13 DIAGNOSIS — I10 Essential (primary) hypertension: Secondary | ICD-10-CM | POA: Diagnosis not present

## 2022-05-13 DIAGNOSIS — E785 Hyperlipidemia, unspecified: Secondary | ICD-10-CM | POA: Diagnosis not present

## 2022-05-13 DIAGNOSIS — E559 Vitamin D deficiency, unspecified: Secondary | ICD-10-CM | POA: Diagnosis not present

## 2022-05-14 DIAGNOSIS — R0600 Dyspnea, unspecified: Secondary | ICD-10-CM | POA: Diagnosis not present

## 2022-05-15 DIAGNOSIS — I10 Essential (primary) hypertension: Secondary | ICD-10-CM | POA: Diagnosis not present

## 2022-05-16 DIAGNOSIS — A492 Hemophilus influenzae infection, unspecified site: Secondary | ICD-10-CM | POA: Diagnosis not present

## 2022-06-08 DIAGNOSIS — I509 Heart failure, unspecified: Secondary | ICD-10-CM | POA: Diagnosis not present

## 2022-06-08 DIAGNOSIS — E559 Vitamin D deficiency, unspecified: Secondary | ICD-10-CM | POA: Diagnosis not present

## 2022-06-08 DIAGNOSIS — E785 Hyperlipidemia, unspecified: Secondary | ICD-10-CM | POA: Diagnosis not present

## 2022-06-08 DIAGNOSIS — M150 Primary generalized (osteo)arthritis: Secondary | ICD-10-CM | POA: Diagnosis not present

## 2022-06-08 DIAGNOSIS — I10 Essential (primary) hypertension: Secondary | ICD-10-CM | POA: Diagnosis not present

## 2022-06-08 DIAGNOSIS — E038 Other specified hypothyroidism: Secondary | ICD-10-CM | POA: Diagnosis not present

## 2022-06-08 DIAGNOSIS — E119 Type 2 diabetes mellitus without complications: Secondary | ICD-10-CM | POA: Diagnosis not present

## 2022-06-08 DIAGNOSIS — D518 Other vitamin B12 deficiency anemias: Secondary | ICD-10-CM | POA: Diagnosis not present

## 2022-06-15 DIAGNOSIS — I10 Essential (primary) hypertension: Secondary | ICD-10-CM | POA: Diagnosis not present

## 2022-06-16 DIAGNOSIS — E785 Hyperlipidemia, unspecified: Secondary | ICD-10-CM | POA: Diagnosis not present

## 2022-06-16 DIAGNOSIS — M150 Primary generalized (osteo)arthritis: Secondary | ICD-10-CM | POA: Diagnosis not present

## 2022-06-16 DIAGNOSIS — R4189 Other symptoms and signs involving cognitive functions and awareness: Secondary | ICD-10-CM | POA: Diagnosis not present

## 2022-06-16 DIAGNOSIS — M19072 Primary osteoarthritis, left ankle and foot: Secondary | ICD-10-CM | POA: Diagnosis not present

## 2022-06-16 DIAGNOSIS — I129 Hypertensive chronic kidney disease with stage 1 through stage 4 chronic kidney disease, or unspecified chronic kidney disease: Secondary | ICD-10-CM | POA: Diagnosis not present

## 2022-06-16 DIAGNOSIS — I10 Essential (primary) hypertension: Secondary | ICD-10-CM | POA: Diagnosis not present

## 2022-06-16 DIAGNOSIS — I739 Peripheral vascular disease, unspecified: Secondary | ICD-10-CM | POA: Diagnosis not present

## 2022-06-16 DIAGNOSIS — E559 Vitamin D deficiency, unspecified: Secondary | ICD-10-CM | POA: Diagnosis not present

## 2022-06-16 DIAGNOSIS — I509 Heart failure, unspecified: Secondary | ICD-10-CM | POA: Diagnosis not present

## 2022-07-02 DIAGNOSIS — M150 Primary generalized (osteo)arthritis: Secondary | ICD-10-CM | POA: Diagnosis not present

## 2022-07-02 DIAGNOSIS — E119 Type 2 diabetes mellitus without complications: Secondary | ICD-10-CM | POA: Diagnosis not present

## 2022-07-02 DIAGNOSIS — E782 Mixed hyperlipidemia: Secondary | ICD-10-CM | POA: Diagnosis not present

## 2022-07-02 DIAGNOSIS — E038 Other specified hypothyroidism: Secondary | ICD-10-CM | POA: Diagnosis not present

## 2022-07-02 DIAGNOSIS — I509 Heart failure, unspecified: Secondary | ICD-10-CM | POA: Diagnosis not present

## 2022-07-02 DIAGNOSIS — D518 Other vitamin B12 deficiency anemias: Secondary | ICD-10-CM | POA: Diagnosis not present

## 2022-07-02 DIAGNOSIS — E559 Vitamin D deficiency, unspecified: Secondary | ICD-10-CM | POA: Diagnosis not present

## 2022-07-02 DIAGNOSIS — I10 Essential (primary) hypertension: Secondary | ICD-10-CM | POA: Diagnosis not present

## 2022-07-16 DIAGNOSIS — I10 Essential (primary) hypertension: Secondary | ICD-10-CM | POA: Diagnosis not present

## 2022-07-30 DIAGNOSIS — Z79899 Other long term (current) drug therapy: Secondary | ICD-10-CM | POA: Diagnosis not present

## 2022-07-30 DIAGNOSIS — E119 Type 2 diabetes mellitus without complications: Secondary | ICD-10-CM | POA: Diagnosis not present

## 2022-07-30 DIAGNOSIS — D518 Other vitamin B12 deficiency anemias: Secondary | ICD-10-CM | POA: Diagnosis not present

## 2022-07-30 DIAGNOSIS — E782 Mixed hyperlipidemia: Secondary | ICD-10-CM | POA: Diagnosis not present

## 2022-08-04 DIAGNOSIS — I10 Essential (primary) hypertension: Secondary | ICD-10-CM | POA: Diagnosis not present

## 2022-08-04 DIAGNOSIS — E119 Type 2 diabetes mellitus without complications: Secondary | ICD-10-CM | POA: Diagnosis not present

## 2022-08-04 DIAGNOSIS — E7849 Other hyperlipidemia: Secondary | ICD-10-CM | POA: Diagnosis not present

## 2022-08-04 DIAGNOSIS — M150 Primary generalized (osteo)arthritis: Secondary | ICD-10-CM | POA: Diagnosis not present

## 2022-08-04 DIAGNOSIS — E559 Vitamin D deficiency, unspecified: Secondary | ICD-10-CM | POA: Diagnosis not present

## 2022-08-04 DIAGNOSIS — I509 Heart failure, unspecified: Secondary | ICD-10-CM | POA: Diagnosis not present

## 2022-08-04 DIAGNOSIS — E038 Other specified hypothyroidism: Secondary | ICD-10-CM | POA: Diagnosis not present

## 2022-08-04 DIAGNOSIS — M19072 Primary osteoarthritis, left ankle and foot: Secondary | ICD-10-CM | POA: Diagnosis not present

## 2022-08-04 DIAGNOSIS — D518 Other vitamin B12 deficiency anemias: Secondary | ICD-10-CM | POA: Diagnosis not present

## 2022-08-18 DIAGNOSIS — I739 Peripheral vascular disease, unspecified: Secondary | ICD-10-CM | POA: Diagnosis not present

## 2022-08-18 DIAGNOSIS — R4189 Other symptoms and signs involving cognitive functions and awareness: Secondary | ICD-10-CM | POA: Diagnosis not present

## 2022-08-18 DIAGNOSIS — E559 Vitamin D deficiency, unspecified: Secondary | ICD-10-CM | POA: Diagnosis not present

## 2022-08-18 DIAGNOSIS — I10 Essential (primary) hypertension: Secondary | ICD-10-CM | POA: Diagnosis not present

## 2022-08-18 DIAGNOSIS — E785 Hyperlipidemia, unspecified: Secondary | ICD-10-CM | POA: Diagnosis not present

## 2022-08-18 DIAGNOSIS — I129 Hypertensive chronic kidney disease with stage 1 through stage 4 chronic kidney disease, or unspecified chronic kidney disease: Secondary | ICD-10-CM | POA: Diagnosis not present

## 2022-08-18 DIAGNOSIS — I509 Heart failure, unspecified: Secondary | ICD-10-CM | POA: Diagnosis not present

## 2022-08-18 DIAGNOSIS — H9190 Unspecified hearing loss, unspecified ear: Secondary | ICD-10-CM | POA: Diagnosis not present

## 2022-08-31 DIAGNOSIS — E119 Type 2 diabetes mellitus without complications: Secondary | ICD-10-CM | POA: Diagnosis not present

## 2022-08-31 DIAGNOSIS — E038 Other specified hypothyroidism: Secondary | ICD-10-CM | POA: Diagnosis not present

## 2022-08-31 DIAGNOSIS — I739 Peripheral vascular disease, unspecified: Secondary | ICD-10-CM | POA: Diagnosis not present

## 2022-08-31 DIAGNOSIS — F79 Unspecified intellectual disabilities: Secondary | ICD-10-CM | POA: Diagnosis not present

## 2022-08-31 DIAGNOSIS — D518 Other vitamin B12 deficiency anemias: Secondary | ICD-10-CM | POA: Diagnosis not present

## 2022-08-31 DIAGNOSIS — I509 Heart failure, unspecified: Secondary | ICD-10-CM | POA: Diagnosis not present

## 2022-08-31 DIAGNOSIS — E7849 Other hyperlipidemia: Secondary | ICD-10-CM | POA: Diagnosis not present

## 2022-08-31 DIAGNOSIS — E559 Vitamin D deficiency, unspecified: Secondary | ICD-10-CM | POA: Diagnosis not present

## 2022-08-31 DIAGNOSIS — I10 Essential (primary) hypertension: Secondary | ICD-10-CM | POA: Diagnosis not present

## 2022-09-08 DIAGNOSIS — R4182 Altered mental status, unspecified: Secondary | ICD-10-CM | POA: Diagnosis not present

## 2022-09-09 DIAGNOSIS — N390 Urinary tract infection, site not specified: Secondary | ICD-10-CM | POA: Diagnosis not present

## 2022-09-14 DIAGNOSIS — I10 Essential (primary) hypertension: Secondary | ICD-10-CM | POA: Diagnosis not present

## 2022-09-25 DIAGNOSIS — R011 Cardiac murmur, unspecified: Secondary | ICD-10-CM | POA: Diagnosis not present

## 2022-09-27 DIAGNOSIS — E038 Other specified hypothyroidism: Secondary | ICD-10-CM | POA: Diagnosis not present

## 2022-09-27 DIAGNOSIS — I739 Peripheral vascular disease, unspecified: Secondary | ICD-10-CM | POA: Diagnosis not present

## 2022-09-27 DIAGNOSIS — E559 Vitamin D deficiency, unspecified: Secondary | ICD-10-CM | POA: Diagnosis not present

## 2022-09-27 DIAGNOSIS — D518 Other vitamin B12 deficiency anemias: Secondary | ICD-10-CM | POA: Diagnosis not present

## 2022-09-27 DIAGNOSIS — F79 Unspecified intellectual disabilities: Secondary | ICD-10-CM | POA: Diagnosis not present

## 2022-09-27 DIAGNOSIS — I10 Essential (primary) hypertension: Secondary | ICD-10-CM | POA: Diagnosis not present

## 2022-09-27 DIAGNOSIS — E119 Type 2 diabetes mellitus without complications: Secondary | ICD-10-CM | POA: Diagnosis not present

## 2022-09-27 DIAGNOSIS — E7849 Other hyperlipidemia: Secondary | ICD-10-CM | POA: Diagnosis not present

## 2022-09-27 DIAGNOSIS — I509 Heart failure, unspecified: Secondary | ICD-10-CM | POA: Diagnosis not present

## 2022-10-14 DIAGNOSIS — I10 Essential (primary) hypertension: Secondary | ICD-10-CM | POA: Diagnosis not present

## 2022-10-27 DIAGNOSIS — M150 Primary generalized (osteo)arthritis: Secondary | ICD-10-CM | POA: Diagnosis not present

## 2022-10-27 DIAGNOSIS — R4189 Other symptoms and signs involving cognitive functions and awareness: Secondary | ICD-10-CM | POA: Diagnosis not present

## 2022-10-27 DIAGNOSIS — E785 Hyperlipidemia, unspecified: Secondary | ICD-10-CM | POA: Diagnosis not present

## 2022-10-27 DIAGNOSIS — I10 Essential (primary) hypertension: Secondary | ICD-10-CM | POA: Diagnosis not present

## 2022-10-27 DIAGNOSIS — D518 Other vitamin B12 deficiency anemias: Secondary | ICD-10-CM | POA: Diagnosis not present

## 2022-10-27 DIAGNOSIS — I739 Peripheral vascular disease, unspecified: Secondary | ICD-10-CM | POA: Diagnosis not present

## 2022-10-27 DIAGNOSIS — I509 Heart failure, unspecified: Secondary | ICD-10-CM | POA: Diagnosis not present

## 2022-10-27 DIAGNOSIS — R4689 Other symptoms and signs involving appearance and behavior: Secondary | ICD-10-CM | POA: Diagnosis not present

## 2022-10-27 DIAGNOSIS — R4182 Altered mental status, unspecified: Secondary | ICD-10-CM | POA: Diagnosis not present

## 2022-10-29 DIAGNOSIS — D518 Other vitamin B12 deficiency anemias: Secondary | ICD-10-CM | POA: Diagnosis not present

## 2022-10-29 DIAGNOSIS — E038 Other specified hypothyroidism: Secondary | ICD-10-CM | POA: Diagnosis not present

## 2022-10-29 DIAGNOSIS — E119 Type 2 diabetes mellitus without complications: Secondary | ICD-10-CM | POA: Diagnosis not present

## 2022-10-29 DIAGNOSIS — E782 Mixed hyperlipidemia: Secondary | ICD-10-CM | POA: Diagnosis not present

## 2022-10-29 DIAGNOSIS — Z79899 Other long term (current) drug therapy: Secondary | ICD-10-CM | POA: Diagnosis not present

## 2022-11-02 DIAGNOSIS — M150 Primary generalized (osteo)arthritis: Secondary | ICD-10-CM | POA: Diagnosis not present

## 2022-11-02 DIAGNOSIS — D518 Other vitamin B12 deficiency anemias: Secondary | ICD-10-CM | POA: Diagnosis not present

## 2022-11-02 DIAGNOSIS — I10 Essential (primary) hypertension: Secondary | ICD-10-CM | POA: Diagnosis not present

## 2022-11-02 DIAGNOSIS — E038 Other specified hypothyroidism: Secondary | ICD-10-CM | POA: Diagnosis not present

## 2022-11-02 DIAGNOSIS — E7849 Other hyperlipidemia: Secondary | ICD-10-CM | POA: Diagnosis not present

## 2022-11-02 DIAGNOSIS — E559 Vitamin D deficiency, unspecified: Secondary | ICD-10-CM | POA: Diagnosis not present

## 2022-11-02 DIAGNOSIS — E119 Type 2 diabetes mellitus without complications: Secondary | ICD-10-CM | POA: Diagnosis not present

## 2022-11-02 DIAGNOSIS — I739 Peripheral vascular disease, unspecified: Secondary | ICD-10-CM | POA: Diagnosis not present

## 2022-11-02 DIAGNOSIS — I509 Heart failure, unspecified: Secondary | ICD-10-CM | POA: Diagnosis not present

## 2022-11-13 DIAGNOSIS — I509 Heart failure, unspecified: Secondary | ICD-10-CM | POA: Diagnosis not present

## 2022-11-13 DIAGNOSIS — R7989 Other specified abnormal findings of blood chemistry: Secondary | ICD-10-CM | POA: Diagnosis not present

## 2022-11-13 DIAGNOSIS — R609 Edema, unspecified: Secondary | ICD-10-CM | POA: Diagnosis not present

## 2022-11-14 DIAGNOSIS — I10 Essential (primary) hypertension: Secondary | ICD-10-CM | POA: Diagnosis not present
